# Patient Record
Sex: Male | Born: 1940 | Race: White | Marital: Single | Smoking: Never smoker
Health system: Southern US, Community
[De-identification: ages and names within clinical notes are randomized; demographics above are authoritative.]

## PROBLEM LIST (undated history)

## (undated) DIAGNOSIS — E785 Hyperlipidemia, unspecified: Secondary | ICD-10-CM

## (undated) DIAGNOSIS — N400 Enlarged prostate without lower urinary tract symptoms: Secondary | ICD-10-CM

## (undated) HISTORY — PX: TONSILLECTOMY: SUR1361

## (undated) HISTORY — PX: OTHER SURGICAL HISTORY: SHX169

## (undated) HISTORY — DX: Hyperlipidemia, unspecified: E78.5

---

## 2012-10-03 ENCOUNTER — Other Ambulatory Visit: Payer: Self-pay | Admitting: Family Medicine

## 2012-10-03 ENCOUNTER — Ambulatory Visit
Admission: RE | Admit: 2012-10-03 | Discharge: 2012-10-03 | Disposition: A | Discharge status: Home / Self Care | Payer: Federal, State, Local not specified - PPO | Source: Ambulatory Visit | Attending: Family Medicine | Admitting: Family Medicine

## 2012-10-03 DIAGNOSIS — M25572 Pain in left ankle and joints of left foot: Secondary | ICD-10-CM

## 2016-03-09 DIAGNOSIS — Z125 Encounter for screening for malignant neoplasm of prostate: Secondary | ICD-10-CM | POA: Diagnosis not present

## 2016-03-09 DIAGNOSIS — I499 Cardiac arrhythmia, unspecified: Secondary | ICD-10-CM | POA: Diagnosis not present

## 2016-03-09 DIAGNOSIS — E7800 Pure hypercholesterolemia, unspecified: Secondary | ICD-10-CM | POA: Diagnosis not present

## 2016-03-09 DIAGNOSIS — M542 Cervicalgia: Secondary | ICD-10-CM | POA: Diagnosis not present

## 2016-03-09 DIAGNOSIS — R300 Dysuria: Secondary | ICD-10-CM | POA: Diagnosis not present

## 2016-03-23 DIAGNOSIS — J209 Acute bronchitis, unspecified: Secondary | ICD-10-CM | POA: Diagnosis not present

## 2016-04-04 DIAGNOSIS — J209 Acute bronchitis, unspecified: Secondary | ICD-10-CM | POA: Diagnosis not present

## 2016-04-13 DIAGNOSIS — J209 Acute bronchitis, unspecified: Secondary | ICD-10-CM | POA: Diagnosis not present

## 2016-10-24 DIAGNOSIS — H353211 Exudative age-related macular degeneration, right eye, with active choroidal neovascularization: Secondary | ICD-10-CM | POA: Diagnosis not present

## 2016-11-29 DIAGNOSIS — H353211 Exudative age-related macular degeneration, right eye, with active choroidal neovascularization: Secondary | ICD-10-CM | POA: Diagnosis not present

## 2016-12-13 DIAGNOSIS — Z01818 Encounter for other preprocedural examination: Secondary | ICD-10-CM | POA: Diagnosis not present

## 2016-12-13 DIAGNOSIS — H2512 Age-related nuclear cataract, left eye: Secondary | ICD-10-CM | POA: Diagnosis not present

## 2017-01-03 DIAGNOSIS — H353211 Exudative age-related macular degeneration, right eye, with active choroidal neovascularization: Secondary | ICD-10-CM | POA: Diagnosis not present

## 2017-02-15 DIAGNOSIS — H353211 Exudative age-related macular degeneration, right eye, with active choroidal neovascularization: Secondary | ICD-10-CM | POA: Diagnosis not present

## 2017-03-11 DIAGNOSIS — I499 Cardiac arrhythmia, unspecified: Secondary | ICD-10-CM | POA: Diagnosis not present

## 2017-03-11 DIAGNOSIS — K219 Gastro-esophageal reflux disease without esophagitis: Secondary | ICD-10-CM | POA: Diagnosis not present

## 2017-03-11 DIAGNOSIS — Z23 Encounter for immunization: Secondary | ICD-10-CM | POA: Diagnosis not present

## 2017-03-11 DIAGNOSIS — E7800 Pure hypercholesterolemia, unspecified: Secondary | ICD-10-CM | POA: Diagnosis not present

## 2017-03-11 DIAGNOSIS — Z125 Encounter for screening for malignant neoplasm of prostate: Secondary | ICD-10-CM | POA: Diagnosis not present

## 2017-03-19 DIAGNOSIS — K080 Exfoliation of teeth due to systemic causes: Secondary | ICD-10-CM | POA: Diagnosis not present

## 2017-04-15 DIAGNOSIS — H353211 Exudative age-related macular degeneration, right eye, with active choroidal neovascularization: Secondary | ICD-10-CM | POA: Diagnosis not present

## 2017-06-17 DIAGNOSIS — H353211 Exudative age-related macular degeneration, right eye, with active choroidal neovascularization: Secondary | ICD-10-CM | POA: Diagnosis not present

## 2017-09-01 DIAGNOSIS — B353 Tinea pedis: Secondary | ICD-10-CM | POA: Diagnosis not present

## 2017-09-16 DIAGNOSIS — H353122 Nonexudative age-related macular degeneration, left eye, intermediate dry stage: Secondary | ICD-10-CM | POA: Diagnosis not present

## 2017-09-19 DIAGNOSIS — K080 Exfoliation of teeth due to systemic causes: Secondary | ICD-10-CM | POA: Diagnosis not present

## 2017-09-23 DIAGNOSIS — B353 Tinea pedis: Secondary | ICD-10-CM | POA: Diagnosis not present

## 2017-10-02 DIAGNOSIS — K080 Exfoliation of teeth due to systemic causes: Secondary | ICD-10-CM | POA: Diagnosis not present

## 2017-10-15 DIAGNOSIS — H353212 Exudative age-related macular degeneration, right eye, with inactive choroidal neovascularization: Secondary | ICD-10-CM | POA: Diagnosis not present

## 2018-02-12 DIAGNOSIS — H353212 Exudative age-related macular degeneration, right eye, with inactive choroidal neovascularization: Secondary | ICD-10-CM | POA: Diagnosis not present

## 2018-03-11 DIAGNOSIS — N401 Enlarged prostate with lower urinary tract symptoms: Secondary | ICD-10-CM | POA: Diagnosis not present

## 2018-03-11 DIAGNOSIS — Z23 Encounter for immunization: Secondary | ICD-10-CM | POA: Diagnosis not present

## 2018-03-11 DIAGNOSIS — E7800 Pure hypercholesterolemia, unspecified: Secondary | ICD-10-CM | POA: Diagnosis not present

## 2018-03-11 DIAGNOSIS — Z1211 Encounter for screening for malignant neoplasm of colon: Secondary | ICD-10-CM | POA: Diagnosis not present

## 2018-03-11 DIAGNOSIS — K219 Gastro-esophageal reflux disease without esophagitis: Secondary | ICD-10-CM | POA: Diagnosis not present

## 2018-03-26 DIAGNOSIS — K080 Exfoliation of teeth due to systemic causes: Secondary | ICD-10-CM | POA: Diagnosis not present

## 2018-04-28 DIAGNOSIS — K080 Exfoliation of teeth due to systemic causes: Secondary | ICD-10-CM | POA: Diagnosis not present

## 2018-09-04 DIAGNOSIS — H3321 Serous retinal detachment, right eye: Secondary | ICD-10-CM | POA: Diagnosis not present

## 2018-09-05 DIAGNOSIS — H34831 Tributary (branch) retinal vein occlusion, right eye, with macular edema: Secondary | ICD-10-CM | POA: Diagnosis not present

## 2018-09-05 DIAGNOSIS — H442E3 Degenerative myopia with other maculopathy, bilateral eye: Secondary | ICD-10-CM | POA: Diagnosis not present

## 2018-09-05 DIAGNOSIS — H353122 Nonexudative age-related macular degeneration, left eye, intermediate dry stage: Secondary | ICD-10-CM | POA: Diagnosis not present

## 2018-09-05 DIAGNOSIS — H35372 Puckering of macula, left eye: Secondary | ICD-10-CM | POA: Diagnosis not present

## 2018-09-08 DIAGNOSIS — H353122 Nonexudative age-related macular degeneration, left eye, intermediate dry stage: Secondary | ICD-10-CM | POA: Diagnosis not present

## 2018-09-08 DIAGNOSIS — H34831 Tributary (branch) retinal vein occlusion, right eye, with macular edema: Secondary | ICD-10-CM | POA: Diagnosis not present

## 2018-10-23 DIAGNOSIS — H34831 Tributary (branch) retinal vein occlusion, right eye, with macular edema: Secondary | ICD-10-CM | POA: Diagnosis not present

## 2019-02-02 DIAGNOSIS — H442E2 Degenerative myopia with other maculopathy, left eye: Secondary | ICD-10-CM | POA: Diagnosis not present

## 2019-02-02 DIAGNOSIS — H442A1 Degenerative myopia with choroidal neovascularization, right eye: Secondary | ICD-10-CM | POA: Diagnosis not present

## 2019-02-02 DIAGNOSIS — H35372 Puckering of macula, left eye: Secondary | ICD-10-CM | POA: Diagnosis not present

## 2019-02-02 DIAGNOSIS — H43813 Vitreous degeneration, bilateral: Secondary | ICD-10-CM | POA: Diagnosis not present

## 2019-03-12 ENCOUNTER — Ambulatory Visit
Admission: RE | Admit: 2019-03-12 | Discharge: 2019-03-12 | Disposition: A | Discharge status: Home / Self Care | Payer: Federal, State, Local not specified - PPO | Source: Ambulatory Visit | Attending: Family Medicine | Admitting: Family Medicine

## 2019-03-12 ENCOUNTER — Other Ambulatory Visit: Payer: Self-pay | Admitting: Family Medicine

## 2019-03-12 ENCOUNTER — Other Ambulatory Visit: Payer: Self-pay

## 2019-03-12 DIAGNOSIS — E7800 Pure hypercholesterolemia, unspecified: Secondary | ICD-10-CM | POA: Diagnosis not present

## 2019-03-12 DIAGNOSIS — R0602 Shortness of breath: Secondary | ICD-10-CM | POA: Diagnosis not present

## 2019-03-12 DIAGNOSIS — K219 Gastro-esophageal reflux disease without esophagitis: Secondary | ICD-10-CM | POA: Diagnosis not present

## 2019-03-12 DIAGNOSIS — N401 Enlarged prostate with lower urinary tract symptoms: Secondary | ICD-10-CM | POA: Diagnosis not present

## 2019-03-16 NOTE — Progress Notes (Signed)
Cardiology Office Note:    Date:  03/18/2019   ID:  Joshua Estrada, DOB 1940/07/08, MRN [id number]  PCP:  Shirline Frees, MD  Cardiologist:  No primary care provider on file.  Electrophysiologist:  None   Referring MD: Shirline Frees, MD   Chief Complaint  Patient presents with  . Chest Pain  . Shortness of Breath    History of Present Illness:    Joshua Estrada is a 79 y.o. male with a hx of HLD, BPH, GERD who is referred by Dr. [name] for evaluation of dyspnea on exertion.  He was seen by Dr. [name] and reported shortness of breath.  Chest x-ray showed cardiomegaly.  Was referred to cardiology for further evaluation.  He also reports that he has been having chest pain.  Describes as dull aching pain in his chest.  He previously noticed it recently, as his wife was admitted to the hospital.  Walking from the common parking garage to his hospital room could cause chest pain.  States that pain resolved with rest.  Also reported significant severe shortness of breath with exertion  Never smoked.  Father had CHF.     Past Medical History:  Diagnosis Date  . Hyperlipidemia     History reviewed. No pertinent surgical history.  Current Medications: Current Meds  Medication Sig  . aspirin EC 81 MG tablet Take 81 mg by mouth daily.  . famotidine (PEPCID) 40 MG tablet Take 1 tablet by mouth daily.  . Multiple Vitamins-Minerals (PRESERVISION AREDS 2 PO) Take 1 tablet by mouth daily.  . simvastatin (ZOCOR) 20 MG tablet Take 20 mg by mouth at bedtime.  . tamsulosin (FLOMAX) 0.4 MG CAPS capsule Take 0.4 mg by mouth daily.     Allergies:   Patient has no known allergies.   Social History   Socioeconomic History  . Marital status: Single    Spouse name: Not on file  . Number of children: Not on file  . Years of education: Not on file  . Highest education level: Not on file  Occupational History  . Not on file  Tobacco Use  . Smoking status: Never Smoker  . Smokeless tobacco: Never  Used  Substance and Sexual Activity  . Alcohol use: Never  . Drug use: Never  . Sexual activity: Not on file  Other Topics Concern  . Not on file  Social History Narrative  . Not on file   Social Determinants of Health   Financial Resource Strain:   . Difficulty of Paying Living Expenses: Not on file  Food Insecurity:   . Worried About Charity fundraiser in the Last Year: Not on file  . Ran Out of Food in the Last Year: Not on file  Transportation Needs:   . Lack of Transportation (Medical): Not on file  . Lack of Transportation (Non-Medical): Not on file  Physical Activity:   . Days of Exercise per Week: Not on file  . Minutes of Exercise per Session: Not on file  Stress:   . Feeling of Stress : Not on file  Social Connections:   . Frequency of Communication with Friends and Family: Not on file  . Frequency of Social Gatherings with Friends and Family: Not on file  . Attends Religious Services: Not on file  . Active Member of Clubs or Organizations: Not on file  . Attends Archivist Meetings: Not on file  . Marital Status: Not on file     Family History: Father  had CHF  ROS:   Please see the history of present illness.    All other systems reviewed and are negative.  EKGs/Labs/Other Studies Reviewed:    The following studies were reviewed today:   EKG:  EKG is ordered today.  The ekg ordered today demonstrates sinus rhythm with significant sinus arrhythmia, rate 80, no ST/T abnormalities  Recent Labs: 03/17/2019: ALT 10; BUN 14; Creatinine, Ser 1.13; Hemoglobin 4.2; NT-Pro BNP 614; Platelets 397; Potassium 4.8; Sodium 140  Recent Lipid Panel No results found for: CHOL, TRIG, HDL, CHOLHDL, VLDL, LDLCALC, LDLDIRECT  Physical Exam:    VS:  BP (!) 154/74   Pulse 80   Ht 6' 2.5" (1.892 m)   Wt 206 lb (93.4 kg)   BMI 26.10 kg/m     Wt Readings from Last 3 Encounters:  03/17/19 206 lb (93.4 kg)     GEN:   in no acute distress HEENT: Normal NECK:  No JVD LYMPHATICS: No lymphadenopathy CARDIAC: Irregular, 2/6 systolic murmur RESPIRATORY:  Clear to auscultation without rales, wheezing or rhonchi  ABDOMEN: Soft, non-tender, non-distended MUSCULOSKELETAL:  2+ BLE edema SKIN: Warm and dry NEUROLOGIC:  Alert and oriented x 3 PSYCHIATRIC:  Normal affect   ASSESSMENT:    1. Precordial pain   2. Bilateral lower extremity edema   3. Medication management    PLAN:    In order of problems listed above:  Chest pain: Description suggest angina, as describes dull aching pain in chest with exertion that resolves with rest.  Not a good candidate for coronary CTA given irregular heart rhythm.  Will evaluate with Lexiscan Myoview.  Will check CMET, CBC.  ADDENDUM: CBC shows hemoglobin 4.2.  Critical result was called to covering physician overnight, Dr. Liane Comber, who instructed patient to go to the ED immediately for evaluation.  Suspect his anginal symptoms are due to severe anemia.  Bilateral lower extremity edema: will check CMET, BNP.  Will order TTE.  Will order LE Duplex to rule out DVT  Hyperlipidemia: On simvastatin 20 mg daily.  LDL 71 on 03/11/18.   RTC in 1 month  Medication Adjustments/Labs and Tests Ordered: Current medicines are reviewed at length with the patient today.  Concerns regarding medicines are outlined above.  Orders Placed This Encounter  Procedures  . CBC  . Pro b natriuretic peptide (BNP)[facility])  . Comprehensive Metabolic Panel (CMET)  . Myocardial Perfusion Imaging  . EKG 12-Lead  . ECHOCARDIOGRAM COMPLETE  . LE VENOUS   No orders of the defined types were placed in this encounter.   Patient Instructions  Medication Instructions:  Continue current medications  *If you need a refill on your cardiac medications before your next appointment, please call your pharmacy*  Lab Work: CBC, CMP and BNP Today  If you have labs (blood work) drawn today and your tests are completely  normal, you will receive your results only by: Marland Kitchen MyChart Message (if you have MyChart) OR . A paper copy in the mail If you have any lab test that is abnormal or we need to change your treatment, we will call you to review the results.  Testing/Procedures: Your physician has requested that you have an echocardiogram. Echocardiography is a painless test that uses sound waves to create images of your heart. It provides your doctor with information about the size and shape of your heart and how well your heart's chambers and valves are working. This procedure takes approximately one hour. There are no restrictions  for this procedure.  Your physician has requested that you have a lexiscan myoview. For further information please visit [email or web address] Please follow instruction sheet, as given.  Your physician has requested that you have a lower extremity venous duplex. This test is an ultrasound of the veins in the legs. It looks at venous blood flow that carries blood from the heart to the legs. Allow one hour for a Lower Venous exam. Allow thirty minutes for an Upper Venous exam. There are no restrictions or special instructions.  Follow-Up: At [facility], you and your health needs are our priority.  As part of our continuing mission to provide you with exceptional heart care, we have created designated Provider Care Teams.  These Care Teams include your primary Cardiologist (physician) and Advanced Practice Providers (APPs -  Physician Assistants and Nurse Practitioners) who all work together to provide you with the care you need, when you need it.  Your next appointment:   2 month(s)  The format for your next appointment:   In Person  Provider:   Epifanio Lesches, MD        Signed, Little Ishikawa, MD  03/18/2019 7:08 AM    [facility]

## 2019-03-17 ENCOUNTER — Other Ambulatory Visit: Payer: Self-pay

## 2019-03-17 ENCOUNTER — Ambulatory Visit: Payer: Federal, State, Local not specified - PPO | Admitting: Cardiology

## 2019-03-17 ENCOUNTER — Encounter: Payer: Self-pay | Admitting: Cardiology

## 2019-03-17 VITALS — BP 154/74 | HR 80 | Ht 74.5 in | Wt 206.0 lb

## 2019-03-17 DIAGNOSIS — R072 Precordial pain: Secondary | ICD-10-CM | POA: Diagnosis not present

## 2019-03-17 DIAGNOSIS — R600 Localized edema: Secondary | ICD-10-CM | POA: Diagnosis not present

## 2019-03-17 DIAGNOSIS — Z79899 Other long term (current) drug therapy: Secondary | ICD-10-CM

## 2019-03-17 DIAGNOSIS — E785 Hyperlipidemia, unspecified: Secondary | ICD-10-CM

## 2019-03-17 NOTE — Patient Instructions (Signed)
Medication Instructions:  Continue current medications  *If you need a refill on your cardiac medications before your next appointment, please call your pharmacy*  Lab Work: CBC, CMP and BNP Today  If you have labs (blood work) drawn today and your tests are completely normal, you will receive your results only by: Marland Kitchen MyChart Message (if you have MyChart) OR . A paper copy in the mail If you have any lab test that is abnormal or we need to change your treatment, we will call you to review the results.  Testing/Procedures: Your physician has requested that you have an echocardiogram. Echocardiography is a painless test that uses sound waves to create images of your heart. It provides your doctor with information about the size and shape of your heart and how well your heart's chambers and valves are working. This procedure takes approximately one hour. There are no restrictions for this procedure.  Your physician has requested that you have a lexiscan myoview. For further information please visit [email or web address] Please follow instruction sheet, as given.  Your physician has requested that you have a lower extremity venous duplex. This test is an ultrasound of the veins in the legs. It looks at venous blood flow that carries blood from the heart to the legs. Allow one hour for a Lower Venous exam. Allow thirty minutes for an Upper Venous exam. There are no restrictions or special instructions.  Follow-Up: At [facility], you and your health needs are our priority.  As part of our continuing mission to provide you with exceptional heart care, we have created designated Provider Care Teams.  These Care Teams include your primary Cardiologist (physician) and Advanced Practice Providers (APPs -  Physician Assistants and Nurse Practitioners) who all work together to provide you with the care you need, when you need it.  Your next appointment:   2 month(s)  The format for your next  appointment:   In Person  Provider:   Epifanio Lesches, MD

## 2019-03-18 ENCOUNTER — Telehealth (HOSPITAL_COMMUNITY): Payer: Self-pay

## 2019-03-18 ENCOUNTER — Other Ambulatory Visit: Payer: Self-pay

## 2019-03-18 ENCOUNTER — Telehealth: Payer: Self-pay | Admitting: Internal Medicine

## 2019-03-18 ENCOUNTER — Inpatient Hospital Stay (HOSPITAL_COMMUNITY)
Admission: EM | Admit: 2019-03-18 | Discharge: 2019-03-24 | DRG: 812 | Disposition: A | Discharge status: Home / Self Care | Payer: Federal, State, Local not specified - PPO | Attending: Internal Medicine | Admitting: Internal Medicine

## 2019-03-18 ENCOUNTER — Observation Stay (HOSPITAL_COMMUNITY): Payer: Federal, State, Local not specified - PPO

## 2019-03-18 ENCOUNTER — Encounter (HOSPITAL_COMMUNITY): Payer: Self-pay | Admitting: Emergency Medicine

## 2019-03-18 DIAGNOSIS — K5730 Diverticulosis of large intestine without perforation or abscess without bleeding: Secondary | ICD-10-CM | POA: Diagnosis present

## 2019-03-18 DIAGNOSIS — K449 Diaphragmatic hernia without obstruction or gangrene: Secondary | ICD-10-CM | POA: Diagnosis not present

## 2019-03-18 DIAGNOSIS — E785 Hyperlipidemia, unspecified: Secondary | ICD-10-CM | POA: Diagnosis present

## 2019-03-18 DIAGNOSIS — Z7982 Long term (current) use of aspirin: Secondary | ICD-10-CM | POA: Diagnosis not present

## 2019-03-18 DIAGNOSIS — Z803 Family history of malignant neoplasm of breast: Secondary | ICD-10-CM

## 2019-03-18 DIAGNOSIS — D649 Anemia, unspecified: Secondary | ICD-10-CM

## 2019-03-18 DIAGNOSIS — D72819 Decreased white blood cell count, unspecified: Secondary | ICD-10-CM | POA: Diagnosis not present

## 2019-03-18 DIAGNOSIS — I82452 Acute embolism and thrombosis of left peroneal vein: Secondary | ICD-10-CM | POA: Diagnosis present

## 2019-03-18 DIAGNOSIS — D500 Iron deficiency anemia secondary to blood loss (chronic): Secondary | ICD-10-CM | POA: Diagnosis present

## 2019-03-18 DIAGNOSIS — K219 Gastro-esophageal reflux disease without esophagitis: Secondary | ICD-10-CM | POA: Diagnosis present

## 2019-03-18 DIAGNOSIS — N400 Enlarged prostate without lower urinary tract symptoms: Secondary | ICD-10-CM | POA: Diagnosis not present

## 2019-03-18 DIAGNOSIS — Z8249 Family history of ischemic heart disease and other diseases of the circulatory system: Secondary | ICD-10-CM | POA: Diagnosis not present

## 2019-03-18 DIAGNOSIS — R0600 Dyspnea, unspecified: Secondary | ICD-10-CM | POA: Diagnosis not present

## 2019-03-18 DIAGNOSIS — Z20822 Contact with and (suspected) exposure to covid-19: Secondary | ICD-10-CM | POA: Diagnosis present

## 2019-03-18 DIAGNOSIS — R5383 Other fatigue: Secondary | ICD-10-CM | POA: Diagnosis not present

## 2019-03-18 DIAGNOSIS — K2100 Gastro-esophageal reflux disease with esophagitis, without bleeding: Secondary | ICD-10-CM | POA: Diagnosis not present

## 2019-03-18 DIAGNOSIS — I4891 Unspecified atrial fibrillation: Secondary | ICD-10-CM | POA: Diagnosis not present

## 2019-03-18 DIAGNOSIS — E538 Deficiency of other specified B group vitamins: Secondary | ICD-10-CM | POA: Diagnosis present

## 2019-03-18 DIAGNOSIS — K635 Polyp of colon: Secondary | ICD-10-CM | POA: Diagnosis present

## 2019-03-18 DIAGNOSIS — D62 Acute posthemorrhagic anemia: Secondary | ICD-10-CM | POA: Insufficient documentation

## 2019-03-18 DIAGNOSIS — R001 Bradycardia, unspecified: Secondary | ICD-10-CM | POA: Diagnosis not present

## 2019-03-18 DIAGNOSIS — I480 Paroxysmal atrial fibrillation: Secondary | ICD-10-CM | POA: Diagnosis not present

## 2019-03-18 DIAGNOSIS — I517 Cardiomegaly: Secondary | ICD-10-CM | POA: Diagnosis present

## 2019-03-18 DIAGNOSIS — R609 Edema, unspecified: Secondary | ICD-10-CM | POA: Diagnosis not present

## 2019-03-18 HISTORY — DX: Benign prostatic hyperplasia without lower urinary tract symptoms: N40.0

## 2019-03-18 LAB — IRON AND TIBC
Iron: 6 ug/dL — ABNORMAL LOW (ref 45–182)
Saturation Ratios: 1 % — ABNORMAL LOW (ref 17.9–39.5)
TIBC: 543 ug/dL — ABNORMAL HIGH (ref 250–450)
UIBC: 537 ug/dL

## 2019-03-18 LAB — COMPREHENSIVE METABOLIC PANEL
ALT: 10 IU/L (ref 0–44)
ALT: 15 U/L (ref 0–44)
AST: 11 IU/L (ref 0–40)
AST: 16 U/L (ref 15–41)
Albumin/Globulin Ratio: 2.4 — ABNORMAL HIGH (ref 1.2–2.2)
Albumin: 4.5 g/dL (ref 3.7–4.7)
Albumin: 4.4 g/dL (ref 3.5–5.0)
Alkaline Phosphatase: 78 IU/L (ref 39–117)
Alkaline Phosphatase: 66 U/L (ref 38–126)
Anion gap: 10 (ref 5–15)
BUN/Creatinine Ratio: 12 (ref 10–24)
BUN: 14 mg/dL (ref 8–27)
BUN: 21 mg/dL (ref 8–23)
Bilirubin Total: 0.4 mg/dL (ref 0.0–1.2)
CO2: 20 mmol/L (ref 20–29)
CO2: 21 mmol/L — ABNORMAL LOW (ref 22–32)
Calcium: 8.9 mg/dL (ref 8.6–10.2)
Calcium: 9.1 mg/dL (ref 8.9–10.3)
Chloride: 107 mmol/L — ABNORMAL HIGH (ref 96–106)
Chloride: 108 mmol/L (ref 98–111)
Creatinine, Ser: 1.13 mg/dL (ref 0.76–1.27)
Creatinine, Ser: 1.18 mg/dL (ref 0.61–1.24)
GFR calc Af Amer: 73 mL/min/{1.73_m2} (ref >59)
GFR calc Af Amer: >60 mL/min (ref >60)
GFR calc non Af Amer: 63 mL/min/{1.73_m2} (ref >59)
GFR calc non Af Amer: 60 mL/min — ABNORMAL LOW (ref >60)
Globulin, Total: 1.9 g/dL (ref 1.5–4.5)
Glucose, Bld: 111 mg/dL — ABNORMAL HIGH (ref 70–99)
Glucose: 106 mg/dL — ABNORMAL HIGH (ref 65–99)
Potassium: 4.8 mmol/L (ref 3.5–5.2)
Potassium: 4.2 mmol/L (ref 3.5–5.1)
Sodium: 140 mmol/L (ref 134–144)
Sodium: 139 mmol/L (ref 135–145)
Total Bilirubin: 0.8 mg/dL (ref 0.3–1.2)
Total Protein: 6.4 g/dL (ref 6.0–8.5)
Total Protein: 7.1 g/dL (ref 6.5–8.1)

## 2019-03-18 LAB — TSH: TSH: 1.383 u[IU]/mL (ref 0.350–4.500)

## 2019-03-18 LAB — CBC WITH DIFFERENTIAL/PLATELET
Abs Immature Granulocytes: 0.01 10*3/uL (ref 0.00–0.07)
Basophils Absolute: 0 10*3/uL (ref 0.0–0.1)
Basophils Relative: 0 %
Eosinophils Absolute: 0.1 10*3/uL (ref 0.0–0.5)
Eosinophils Relative: 2 %
HCT: 17.4 % — ABNORMAL LOW (ref 39.0–52.0)
Hemoglobin: 4.3 g/dL — CL (ref 13.0–17.0)
Immature Granulocytes: 0 %
Lymphocytes Relative: 29 %
Lymphs Abs: 1 10*3/uL (ref 0.7–4.0)
MCH: 17.3 pg — ABNORMAL LOW (ref 26.0–34.0)
MCHC: 24.7 g/dL — ABNORMAL LOW (ref 30.0–36.0)
MCV: 69.9 fL — ABNORMAL LOW (ref 80.0–100.0)
Monocytes Absolute: 0.4 10*3/uL (ref 0.1–1.0)
Monocytes Relative: 10 %
Neutro Abs: 2 10*3/uL (ref 1.7–7.7)
Neutrophils Relative %: 59 %
Platelets: 331 10*3/uL (ref 150–400)
RBC: 2.49 MIL/uL — ABNORMAL LOW (ref 4.22–5.81)
RDW: 19.2 % — ABNORMAL HIGH (ref 11.5–15.5)
WBC: 3.5 10*3/uL — ABNORMAL LOW (ref 4.0–10.5)
nRBC: 0 % (ref 0.0–0.2)

## 2019-03-18 LAB — VITAMIN B12: Vitamin B-12: 128 pg/mL — ABNORMAL LOW (ref 180–914)

## 2019-03-18 LAB — FOLATE: Folate: 7.2 ng/mL (ref >5.9)

## 2019-03-18 LAB — PREPARE RBC (CROSSMATCH)

## 2019-03-18 LAB — RETICULOCYTES
Immature Retic Fract: 17.3 % — ABNORMAL HIGH (ref 2.3–15.9)
RBC.: 2.46 MIL/uL — ABNORMAL LOW (ref 4.22–5.81)
Retic Count, Absolute: 45.8 10*3/uL (ref 19.0–186.0)
Retic Ct Pct: 1.9 % (ref 0.4–3.1)

## 2019-03-18 LAB — ABO/RH: ABO/RH(D): AB POS

## 2019-03-18 LAB — CBC
Hematocrit: 16.6 % — CL (ref 37.5–51.0)
Hemoglobin: 4.2 g/dL — CL (ref 13.0–17.7)
MCH: 16.9 pg — ABNORMAL LOW (ref 26.6–33.0)
MCHC: 25.3 g/dL — ABNORMAL LOW (ref 31.5–35.7)
MCV: 67 fL — ABNORMAL LOW (ref 79–97)
Platelets: 397 10*3/uL (ref 150–450)
RBC: 2.48 x10E6/uL — CL (ref 4.14–5.80)
RDW: 17.6 % — ABNORMAL HIGH (ref 11.6–15.4)
WBC: 4 10*3/uL (ref 3.4–10.8)

## 2019-03-18 LAB — HEMOGLOBIN AND HEMATOCRIT, BLOOD
HCT: 23.8 % — ABNORMAL LOW (ref 39.0–52.0)
HCT: 23.1 % — ABNORMAL LOW (ref 39.0–52.0)
Hemoglobin: 7 g/dL — ABNORMAL LOW (ref 13.0–17.0)
Hemoglobin: 6.7 g/dL — CL (ref 13.0–17.0)

## 2019-03-18 LAB — SARS CORONAVIRUS 2 (TAT 6-24 HRS): SARS Coronavirus 2: NEGATIVE

## 2019-03-18 LAB — FERRITIN: Ferritin: 3 ng/mL — ABNORMAL LOW (ref 24–336)

## 2019-03-18 LAB — POC OCCULT BLOOD, ED: Fecal Occult Bld: NEGATIVE

## 2019-03-18 LAB — PROTIME-INR
INR: 1.1 (ref 0.8–1.2)
Prothrombin Time: 14.4 seconds (ref 11.4–15.2)

## 2019-03-18 LAB — PRO B NATRIURETIC PEPTIDE: NT-Pro BNP: 614 pg/mL — ABNORMAL HIGH (ref 0–486)

## 2019-03-18 MED ORDER — SODIUM CHLORIDE 0.9% IV SOLUTION: Freq: Once | INTRAVENOUS | Status: AC

## 2019-03-18 MED ORDER — SODIUM CHLORIDE 0.9% IV SOLUTION: Freq: Once | INTRAVENOUS | Status: DC

## 2019-03-18 MED ORDER — SODIUM CHLORIDE 0.9 % IV SOLN: Freq: Once | INTRAVENOUS | Status: AC

## 2019-03-18 MED ORDER — ACETAMINOPHEN 650 MG RE SUPP: 650.0000 mg | Freq: Four times a day (QID) | RECTAL | Status: DC | PRN

## 2019-03-18 MED ORDER — FAMOTIDINE 20 MG PO TABS
20.0000 mg | ORAL_TABLET | Freq: Two times a day (BID) | ORAL | Status: DC
  Administered 2019-03-18 – 2019-03-24 (×11): 20 mg via ORAL
  Filled 2019-03-18 (×11): qty 1

## 2019-03-18 MED ORDER — SODIUM CHLORIDE 0.9 % IV SOLN
510.0000 mg | Freq: Once | INTRAVENOUS | Status: AC
  Administered 2019-03-19: 510 mg via INTRAVENOUS
  Filled 2019-03-18: qty 17

## 2019-03-18 MED ORDER — ALBUTEROL SULFATE (2.5 MG/3ML) 0.083% IN NEBU: 2.5000 mg | INHALATION_SOLUTION | Freq: Four times a day (QID) | RESPIRATORY_TRACT | Status: DC | PRN

## 2019-03-18 MED ORDER — VITAMIN B-12 1000 MCG PO TABS
1000.0000 ug | ORAL_TABLET | Freq: Every day | ORAL | Status: DC
  Administered 2019-03-18 – 2019-03-24 (×6): 1000 ug via ORAL
  Filled 2019-03-18 (×6): qty 1

## 2019-03-18 MED ORDER — TAMSULOSIN HCL 0.4 MG PO CAPS
0.4000 mg | ORAL_CAPSULE | Freq: Every day | ORAL | Status: DC
  Administered 2019-03-19 – 2019-03-23 (×6): 0.4 mg via ORAL
  Filled 2019-03-18 (×6): qty 1

## 2019-03-18 MED ORDER — ACETAMINOPHEN 325 MG PO TABS: 650.0000 mg | ORAL_TABLET | Freq: Four times a day (QID) | ORAL | Status: DC | PRN

## 2019-03-18 MED ORDER — ONDANSETRON HCL 4 MG/2ML IJ SOLN: 4.0000 mg | Freq: Four times a day (QID) | INTRAMUSCULAR | Status: DC | PRN

## 2019-03-18 MED ORDER — ONDANSETRON HCL 4 MG PO TABS: 4.0000 mg | ORAL_TABLET | Freq: Four times a day (QID) | ORAL | Status: DC | PRN

## 2019-03-18 MED ORDER — SODIUM CHLORIDE 0.9% FLUSH
3.0000 mL | Freq: Two times a day (BID) | INTRAVENOUS | Status: DC
  Administered 2019-03-19 – 2019-03-24 (×7): 3 mL via INTRAVENOUS

## 2019-03-18 MED ORDER — SODIUM CHLORIDE 0.9 % IV SOLN
10.0000 mL/h | Freq: Once | INTRAVENOUS | Status: AC
  Administered 2019-03-18: 10 mL/h via INTRAVENOUS

## 2019-03-18 MED ORDER — SIMVASTATIN 20 MG PO TABS
20.0000 mg | ORAL_TABLET | Freq: Every day | ORAL | Status: DC
  Administered 2019-03-19 – 2019-03-23 (×6): 20 mg via ORAL
  Filled 2019-03-18 (×6): qty 1

## 2019-03-18 MED ORDER — FUROSEMIDE 10 MG/ML IJ SOLN
20.0000 mg | Freq: Once | INTRAMUSCULAR | Status: AC
  Administered 2019-03-18: 20 mg via INTRAVENOUS
  Filled 2019-03-18: qty 2

## 2019-03-18 NOTE — Telephone Encounter (Signed)
Encounter complete. 

## 2019-03-18 NOTE — Progress Notes (Signed)
1700 Received pt from ED via stretcher. Assisted to bed. Third unit of blood transfusion was completed in ED.

## 2019-03-18 NOTE — Progress Notes (Addendum)
CRITICAL VALUE STICKER  CRITICAL VALUE: Hemoglobin 6.7  RECEIVER: Beverely Low  DATE & TIME NOTIFIED: 03/18/19 2004  MESSENGER (representative from lab): Santina Evans   MD NOTIFIED: On call hosp. MD M. Denny  TIME OF NOTIFICATION: 2024  RESPONSE: Waiting new orders.  (2100- New orders for 2units of RBC)    0733: patient tolerated transfusions well and has completed both units. Post H&H placed to be drawn.

## 2019-03-18 NOTE — ED Notes (Signed)
Left message for daughter to update on pt

## 2019-03-18 NOTE — ED Provider Notes (Signed)
[facility] EMERGENCY DEPARTMENT Provider Note   CSN: [id number] Arrival date & time: 03/18/19  8299     History Chief Complaint  Patient presents with  . Abnormal Labs    Hgb=4.2    Joshua Estrada is a 79 y.o. male.  Patient is a 79 year old male with a history of hyperlipidemia who is presenting today because he was called by his doctor with an abnormal hemoglobin of 4.2.  Patient states for the last 2 to 3 months he has had exertional dyspnea that improves with sitting down but over the last month he has become much more fatigued and even with minimal exertion he has to sit down and rest because he just becomes so tired.  He has not had any chest pain and he does not have any resting shortness of breath.  He has not noticed any black or bloody stools.  He had a heme occult test done a few years ago which was negative and his not had abnormal colonoscopies in the past.  He takes an 81 mg aspirin daily but no other anticoagulation.  No history of anemia.  He was seeing the cardiologist yesterday because on chest x-ray he had some cardiomegaly and was following up for that.  He does not have a history of hypertension and denies any recent Covid-like symptoms such as fever, cough, congestion.  The history is provided by the patient.       Past Medical History:  Diagnosis Date  . Hyperlipidemia     There are no problems to display for this patient.   History reviewed. No pertinent surgical history.     No family history on file.  Social History   Tobacco Use  . Smoking status: Never Smoker  . Smokeless tobacco: Never Used  Substance Use Topics  . Alcohol use: Never  . Drug use: Never    Home Medications Prior to Admission medications   Medication Sig Start Date End Date Taking? Authorizing Provider  aspirin EC 81 MG tablet Take 81 mg by mouth daily.    [provider]  famotidine (PEPCID) 40 MG tablet Take 1 tablet by mouth daily. 02/26/19    [provider]  Multiple Vitamins-Minerals (PRESERVISION AREDS 2 PO) Take 1 tablet by mouth daily.    [provider]  simvastatin (ZOCOR) 20 MG tablet Take 20 mg by mouth at bedtime. 02/26/19   [provider]  tamsulosin (FLOMAX) 0.4 MG CAPS capsule Take 0.4 mg by mouth daily. 02/01/19   [provider]    Allergies    Patient has no known allergies.  Review of Systems   Review of Systems  All other systems reviewed and are negative.   Physical Exam Updated Vital Signs BP 134/67   Pulse (!) 105   Temp 98.8 F (37.1 C) (Oral)   SpO2 92%   Physical Exam Vitals and nursing note reviewed.  Constitutional:      General: He is not in acute distress.    Appearance: He is well-developed and normal weight.  HENT:     Head: Normocephalic and atraumatic.  Eyes:     Pupils: Pupils are equal, round, and reactive to light.     Comments: Pale conjunctive a  Cardiovascular:     Rate and Rhythm: Normal rate and regular rhythm.     Heart sounds: No murmur.  Pulmonary:     Effort: Pulmonary effort is normal. No respiratory distress.     Breath sounds: Normal breath  sounds. No wheezing or rales.  Abdominal:     General: There is no distension.     Palpations: Abdomen is soft.     Tenderness: There is no abdominal tenderness. There is no guarding or rebound.  Genitourinary:    Rectum: Normal. Guaiac result negative.  Musculoskeletal:        General: No tenderness. Normal range of motion.     Cervical back: Normal range of motion and neck supple.     Right lower leg: Edema present.     Left lower leg: Edema present.  Skin:    General: Skin is warm and dry.     Coloration: Skin is pale.     Findings: No erythema or rash.  Neurological:     General: No focal deficit present.     Mental Status: He is alert and oriented to person, place, and time. Mental status is at baseline.  Psychiatric:        Behavior: Behavior normal.     ED Results /  Procedures / Treatments   Labs (all labs ordered are listed, but only abnormal results are displayed) Labs Reviewed  CBC WITH DIFFERENTIAL/PLATELET - Abnormal; Notable for the following components:      Result Value   WBC 3.5 (*)    RBC 2.49 (*)    Hemoglobin 4.3 (*)    HCT 17.4 (*)    MCV 69.9 (*)    MCH 17.3 (*)    MCHC 24.7 (*)    RDW 19.2 (*)    All other components within normal limits  COMPREHENSIVE METABOLIC PANEL - Abnormal; Notable for the following components:   CO2 21 (*)    Glucose, Bld 111 (*)    GFR calc non Af Amer 60 (*)    All other components within normal limits  RETICULOCYTES - Abnormal; Notable for the following components:   RBC. 2.46 (*)    Immature Retic Fract 17.3 (*)    All other components within normal limits  SARS CORONAVIRUS 2 (TAT 6-24 HRS)  VITAMIN B12  FOLATE  IRON AND TIBC  FERRITIN  PROTIME-INR  POC OCCULT BLOOD, ED  TYPE AND SCREEN  PREPARE RBC (CROSSMATCH)  ABO/RH  PREPARE RBC (CROSSMATCH)    EKG EKG Interpretation  Date/Time:  Wednesday March 18 2019 06:55:58 EST Ventricular Rate:  81 PR Interval:  184 QRS Duration: 80 QT Interval:  388 QTC Calculation: 450 R Axis:   45 Text Interpretation: Sinus rhythm with marked sinus arrhythmia Otherwise normal ECG No significant change since last tracing Confirmed by Blanchie Dessert [phone]) on 03/18/2019 7:35:35 AM   Radiology No results found.  Procedures Procedures (including critical care time)  Medications Ordered in ED Medications  0.9 %  sodium chloride infusion (has no administration in time range)    ED Course  I have reviewed the triage vital signs and the nursing notes.  Pertinent labs & imaging results that were available during my care of the patient were reviewed by me and considered in my medical decision making (see chart for details).    MDM Rules/Calculators/A&P                      79 year old male presenting today with severe anemia.  Months of  worsening symptoms.  Stable on exam with mild tachycardia and oxygen saturation of 92%.  Patient has no abdominal pain and is denying any chest pain.  Labs are pending and Hemoccult is negative.  BMP from yesterday  was within normal limits, BNP was elevated at 600 and chest x-ray from last week showed no acute lung issues with cardiomegaly.  Given patient is not having any specific new symptoms related to heart or lungs do not feel a repeat x-ray is necessary at this time.  10:51 AM Patient CBC is consistent with a hemoglobin of 4.3 but also a mild leukopenia with a white count of 3.5, decreased MCV but normal platelet count.  Reticulocyte site count is normal but immature reticulocytes are elevated.  Rest of anemia panel is still pending.  Patient will be transfused 2 units.  Will admit for further care.  Concern for bone marrow issue is the cause of his symptoms.  Hemoccult was negative.  CRITICAL CARE Performed by: Jwan Hornbaker Total critical care time: 30 minutes Critical care time was exclusive of separately billable procedures and treating other patients. Critical care was necessary to treat or prevent imminent or life-threatening deterioration. Critical care was time spent personally by me on the following activities: development of treatment plan with patient and/or surrogate as well as nursing, discussions with consultants, evaluation of patient's response to treatment, examination of patient, obtaining history from patient or surrogate, ordering and performing treatments and interventions, ordering and review of laboratory studies, ordering and review of radiographic studies, pulse oximetry and re-evaluation of patient's condition.   Final Clinical Impression(s) / ED Diagnoses Final diagnoses:  Symptomatic anemia    Rx / DC Orders ED Discharge Orders    None       Gwyneth Sprout, MD 03/18/19 1052

## 2019-03-18 NOTE — ED Triage Notes (Signed)
Patient received a call from his MD this morning advised him to go ER for blood transfusion , Hgb=4.9 , pale , denies rectal bleeding , respirations unlabored / denies pain .

## 2019-03-18 NOTE — Telephone Encounter (Signed)
Cardiology Moonlighter Note  Returned page from [place]. Patient had bloodwork drawn yesterday to work up shortness of breath and chest pain. Was found to have hemoglobin of 4.2 (hematocrit 16.6).   I contacted the patient by phone and made him aware of these results. I instructed him to come to the ED immediately for evaluation. He should either have someone bring him to the ED immediately or call 911 for transport. He agrees and will do so.  Rosario Jacks, MD Cardiology Fellow, PGY-7

## 2019-03-18 NOTE — ED Notes (Signed)
Pt given urinal.

## 2019-03-18 NOTE — H&P (Addendum)
History and Physical    Joshua Estrada IHK:[id number] DOB: 12-28-1940 DOA: 03/18/2019  Referring MD/NP/PA: Blanchie Dessert PCP: Shirline Frees, MD  Patient coming from: Home  Chief Complaint: Low blood count  I have personally briefly reviewed patient's old medical records in [place]   HPI: Joshua Estrada is a 79 y.o. male with medical history significant of hyperlipidemia; who presents after being told that he had an low blood counts.  Patient had followed up with cardiology after his primary care provider had obtained a chest x-ray which noted cardiomegaly.  Over the last 2 months he had been experiencing progressive exertional dyspnea.  Complains of being unable to walk or do anything without needing to rest.  Associated symptoms include lower extremity swelling.  He has not had any dark or bloody stools to his knowledge.  Patient takes a daily aspirin when he remember, but is not on any other anticoagulation.  Denies use of any NSAIDs, abdominal pain, weight loss to his knowledge, nausea, vomiting, or diarrhea.  Patient notes that he has had a stool test done several years ago, but has never had a colostomy.   ED Course: Upon admission to the emergency department patient was noted to be slightly tachypneic and tachycardic, but all other vital signs within normal limits.  Labs significant for WBC 3.5 and hemoglobin 4.3 with low MCV and MCH.  An anemia panel was pending.  Stool guaiacs were noted to be negative.  Patient was ordered to be transfused 3 units of packed red blood cells.  TRH called to admit.  Review of Systems  Constitutional: Positive for malaise/fatigue. Negative for fever and weight loss.  HENT: Negative for congestion and sinus pain.   Eyes: Negative for photophobia and pain.  Respiratory: Positive for shortness of breath. Negative for cough.   Cardiovascular: Positive for leg swelling. Negative for chest pain.  Gastrointestinal: Negative for abdominal pain, blood in  stool, nausea and vomiting.  Genitourinary: Negative for dysuria and hematuria.  Musculoskeletal: Negative for falls and myalgias.  Skin: Negative for rash.  Neurological: Negative for focal weakness and loss of consciousness.  Psychiatric/Behavioral: Negative for substance abuse.    Past Medical History:  Diagnosis Date  . BPH (benign prostatic hyperplasia)   . Hyperlipidemia     Past Surgical History:  Procedure Laterality Date  . Cataract surgery    . TONSILLECTOMY     As a child     reports that he has never smoked. He has never used smokeless tobacco. He reports that he does not drink alcohol or use drugs.  No Known Allergies  Family History  Problem Relation Age of Onset  . Breast cancer Mother   . Congestive Heart Failure Father     Prior to Admission medications   Medication Sig Start Date End Date Taking? Authorizing Provider  aspirin EC 81 MG tablet Take 81 mg by mouth daily.    [provider]  famotidine (PEPCID) 40 MG tablet Take 1 tablet by mouth daily. 02/26/19   [provider]  Multiple Vitamins-Minerals (PRESERVISION AREDS 2 PO) Take 1 tablet by mouth daily.    [provider]  simvastatin (ZOCOR) 20 MG tablet Take 20 mg by mouth at bedtime. 02/26/19   [provider]  tamsulosin (FLOMAX) 0.4 MG CAPS capsule Take 0.4 mg by mouth daily. 02/01/19   [provider]    Physical Exam:  Constitutional: Elderly male who appears to be in no acute distress at this time Vitals:  03/18/19 1107 03/18/19 1134 03/18/19 1145 03/18/19 1149  BP: 125/62 129/80 116/61 137/69  Pulse: 61 67 76 70  Resp: 19 (!) 21 (!) 21 15  Temp: 97.9 F (36.6 C) 98.2 F (36.8 C)  98.7 F (37.1 C)  TempSrc: Oral Oral  Oral  SpO2: 98% 99% 99%    Eyes: PERRL, lids and conjunctivae normal ENMT: Mucous membranes are moist. Posterior pharynx clear of any exudate or lesions.  Neck: normal, supple, no masses, no thyromegaly Respiratory:  clear to auscultation bilaterally, no wheezing, no crackles. Normal respiratory effort. No accessory muscle use.  Cardiovascular: Regular rate and rhythm, no murmurs / rubs / gallops. No extremity edema. 2+ pedal pulses. No carotid bruits.  Abdomen: no tenderness, no masses palpated. No hepatosplenomegaly. Bowel sounds positive.  Musculoskeletal: no clubbing / cyanosis. No joint deformity upper and lower extremities. Good ROM, no contractures. Normal muscle tone.  Skin: Pallor present.  No rashes, lesions, ulcers. No induration Neurologic: CN 2-12 grossly intact. Sensation intact, DTR normal. Strength 5/5 in all 4.  Psychiatric: Normal judgment and insight. Alert and oriented x 3. Normal mood.     Labs on Admission: I have personally reviewed following labs and imaging studies  CBC: Recent Labs  Lab 03/17/19 1538 03/18/19 0839  WBC 4.0 3.5*  NEUTROABS  --  2.0  HGB 4.2* 4.3*  HCT 16.6* 17.4*  MCV 67* 69.9*  PLT 397 331   Basic Metabolic Panel: Recent Labs  Lab 03/17/19 1538 03/18/19 0656  NA 140 139  K 4.8 4.2  CL 107* 108  CO2 20 21*  GLUCOSE 106* 111*  BUN 14 21  CREATININE 1.13 1.18  CALCIUM 8.9 9.1   GFR: Estimated Creatinine Clearance: 62.8 mL/min (by C-G formula based on SCr of 1.18 mg/dL). Liver Function Tests: Recent Labs  Lab 03/17/19 1538 03/18/19 0656  AST 11 16  ALT 10 15  ALKPHOS 78 66  BILITOT 0.4 0.8  PROT 6.4 7.1  ALBUMIN 4.5 4.4   No results for input(s): LIPASE, AMYLASE in the last 168 hours. No results for input(s): AMMONIA in the last 168 hours. Coagulation Profile: No results for input(s): INR, PROTIME in the last 168 hours. Cardiac Enzymes: No results for input(s): CKTOTAL, CKMB, CKMBINDEX, TROPONINI in the last 168 hours. BNP (last 3 results) Recent Labs    03/17/19 1538  PROBNP 614*   HbA1C: No results for input(s): HGBA1C in the last 72 hours. CBG: No results for input(s): GLUCAP in the last 168 hours. Lipid Profile: No  results for input(s): CHOL, HDL, LDLCALC, TRIG, CHOLHDL, LDLDIRECT in the last 72 hours. Thyroid Function Tests: No results for input(s): TSH, T4TOTAL, FREET4, T3FREE, THYROIDAB in the last 72 hours. Anemia Panel: Recent Labs    03/18/19 0839  VITAMINB12 128*  FOLATE 7.2  FERRITIN 3*  TIBC 543*  IRON 6*  RETICCTPCT 1.9   Urine analysis: No results found for: COLORURINE, APPEARANCEUR, LABSPEC, PHURINE, GLUCOSEU, HGBUR, BILIRUBINUR, KETONESUR, PROTEINUR, UROBILINOGEN, NITRITE, LEUKOCYTESUR Sepsis Labs: No results found for this or any previous visit (from the past 240 hour(s)).   Radiological Exams on Admission: No results found.  EKG: Independently reviewed.  Sinus rhythm 81 bpm  Assessment/Plan Iron deficiency anemia due to chronic blood loss, vitamin B12 deficiency: Acute.  Patient presents with hemoglobin 4.8 with low MCV and MCH.  Patient uses aspirin, but not on any other blood thinners.  Stool guaiacs were noted to be negative.  Anemia panel revealed iron  6, TIBC 543,  ferritin 3, and vitamin B12 128.  -Admit to a medical telemetry bed -Follow-up anemia panel -Clear liquid diet -Held aspirin -Continue with transfusion of 3 units of packed red blood cells -Give 20 mg of Lasix IV and continue to monitor for signs of fluid overload -Recheck H&H posttransfusion -Scheduled Feraheme transfusion in a.m. -Start Vitamin B12 1000 mcg daily -[facility] consulted, we will follow-up for further recommendations  Leukopenia: Acute.  WBC 3.5 on admission. -Continue to monitor  Cardiomegaly: Patient reported having a chest x-ray which showed cardiomegaly outpatient setting by his primary care provider.  He had been sent to Dr. Bjorn Pippin of cardiology who had wanted to obtain an echocardiogram, Doppler ultrasound of the lower extremities, and obtain a myocardial perfusion study. -Discussed with Dr.Schumann agreed that it was reasonable to go ahead and obtain  the Doppler ultrasound of the  lower extremities today and obtain the echocardiogram possibly tomorrow after blood has been given. -Follow-up Doppler ultrasound  of lower extremities and echocardiogram  GERD -Continue PPI  BPH -Continue Flomax  Hyperlipidemia: Home medications include simvastatin. -Continue Zocor   DVT prophylaxis:  SCDs Code Status: Full Family Communication: No family present at bedside. Disposition Plan: Possible discharge home in 1 to 2 days Consults called: [facility] Admission status: inpatient   Clydie Braun MD [facility] Pager [phone]   If 7PM-7AM, please contact night-coverage [email or web address] Password [employer]  03/18/2019, 12:15 PM

## 2019-03-19 ENCOUNTER — Telehealth (HOSPITAL_COMMUNITY): Payer: Self-pay

## 2019-03-19 ENCOUNTER — Ambulatory Visit (HOSPITAL_COMMUNITY): Payer: Federal, State, Local not specified - PPO

## 2019-03-19 ENCOUNTER — Ambulatory Visit (HOSPITAL_BASED_OUTPATIENT_CLINIC_OR_DEPARTMENT_OTHER): Payer: Federal, State, Local not specified - PPO

## 2019-03-19 DIAGNOSIS — R0600 Dyspnea, unspecified: Secondary | ICD-10-CM | POA: Diagnosis present

## 2019-03-19 DIAGNOSIS — E785 Hyperlipidemia, unspecified: Secondary | ICD-10-CM | POA: Diagnosis present

## 2019-03-19 DIAGNOSIS — D123 Benign neoplasm of transverse colon: Secondary | ICD-10-CM | POA: Diagnosis not present

## 2019-03-19 DIAGNOSIS — I371 Nonrheumatic pulmonary valve insufficiency: Secondary | ICD-10-CM

## 2019-03-19 DIAGNOSIS — I361 Nonrheumatic tricuspid (valve) insufficiency: Secondary | ICD-10-CM | POA: Diagnosis not present

## 2019-03-19 DIAGNOSIS — K3189 Other diseases of stomach and duodenum: Secondary | ICD-10-CM | POA: Diagnosis not present

## 2019-03-19 DIAGNOSIS — I517 Cardiomegaly: Secondary | ICD-10-CM | POA: Diagnosis not present

## 2019-03-19 DIAGNOSIS — E538 Deficiency of other specified B group vitamins: Secondary | ICD-10-CM | POA: Diagnosis present

## 2019-03-19 DIAGNOSIS — Q399 Congenital malformation of esophagus, unspecified: Secondary | ICD-10-CM | POA: Diagnosis not present

## 2019-03-19 DIAGNOSIS — I4891 Unspecified atrial fibrillation: Secondary | ICD-10-CM | POA: Diagnosis present

## 2019-03-19 DIAGNOSIS — I2699 Other pulmonary embolism without acute cor pulmonale: Secondary | ICD-10-CM | POA: Diagnosis not present

## 2019-03-19 DIAGNOSIS — Z20822 Contact with and (suspected) exposure to covid-19: Secondary | ICD-10-CM | POA: Diagnosis not present

## 2019-03-19 DIAGNOSIS — Z8249 Family history of ischemic heart disease and other diseases of the circulatory system: Secondary | ICD-10-CM | POA: Diagnosis not present

## 2019-03-19 DIAGNOSIS — D649 Anemia, unspecified: Secondary | ICD-10-CM | POA: Diagnosis present

## 2019-03-19 DIAGNOSIS — Z803 Family history of malignant neoplasm of breast: Secondary | ICD-10-CM | POA: Diagnosis not present

## 2019-03-19 DIAGNOSIS — K219 Gastro-esophageal reflux disease without esophagitis: Secondary | ICD-10-CM | POA: Diagnosis present

## 2019-03-19 DIAGNOSIS — Z7982 Long term (current) use of aspirin: Secondary | ICD-10-CM | POA: Diagnosis not present

## 2019-03-19 DIAGNOSIS — K449 Diaphragmatic hernia without obstruction or gangrene: Secondary | ICD-10-CM | POA: Diagnosis not present

## 2019-03-19 DIAGNOSIS — D500 Iron deficiency anemia secondary to blood loss (chronic): Secondary | ICD-10-CM | POA: Diagnosis not present

## 2019-03-19 DIAGNOSIS — R5383 Other fatigue: Secondary | ICD-10-CM | POA: Diagnosis not present

## 2019-03-19 DIAGNOSIS — N400 Enlarged prostate without lower urinary tract symptoms: Secondary | ICD-10-CM | POA: Diagnosis present

## 2019-03-19 DIAGNOSIS — I480 Paroxysmal atrial fibrillation: Secondary | ICD-10-CM | POA: Diagnosis not present

## 2019-03-19 DIAGNOSIS — R609 Edema, unspecified: Secondary | ICD-10-CM

## 2019-03-19 DIAGNOSIS — R001 Bradycardia, unspecified: Secondary | ICD-10-CM | POA: Diagnosis present

## 2019-03-19 DIAGNOSIS — I82452 Acute embolism and thrombosis of left peroneal vein: Secondary | ICD-10-CM | POA: Diagnosis not present

## 2019-03-19 DIAGNOSIS — K5730 Diverticulosis of large intestine without perforation or abscess without bleeding: Secondary | ICD-10-CM | POA: Diagnosis not present

## 2019-03-19 DIAGNOSIS — D509 Iron deficiency anemia, unspecified: Secondary | ICD-10-CM | POA: Diagnosis not present

## 2019-03-19 DIAGNOSIS — D72819 Decreased white blood cell count, unspecified: Secondary | ICD-10-CM | POA: Diagnosis present

## 2019-03-19 DIAGNOSIS — K635 Polyp of colon: Secondary | ICD-10-CM | POA: Diagnosis not present

## 2019-03-19 LAB — BASIC METABOLIC PANEL
Anion gap: 9 (ref 5–15)
BUN: 10 mg/dL (ref 8–23)
CO2: 24 mmol/L (ref 22–32)
Calcium: 8.9 mg/dL (ref 8.9–10.3)
Chloride: 106 mmol/L (ref 98–111)
Creatinine, Ser: 0.99 mg/dL (ref 0.61–1.24)
GFR calc Af Amer: >60 mL/min (ref >60)
GFR calc non Af Amer: >60 mL/min (ref >60)
Glucose, Bld: 80 mg/dL (ref 70–99)
Potassium: 3.6 mmol/L (ref 3.5–5.1)
Sodium: 139 mmol/L (ref 135–145)

## 2019-03-19 LAB — CBC
HCT: 28.6 % — ABNORMAL LOW (ref 39.0–52.0)
Hemoglobin: 8.6 g/dL — ABNORMAL LOW (ref 13.0–17.0)
MCH: 22.5 pg — ABNORMAL LOW (ref 26.0–34.0)
MCHC: 30.1 g/dL (ref 30.0–36.0)
MCV: 74.9 fL — ABNORMAL LOW (ref 80.0–100.0)
Platelets: 303 10*3/uL (ref 150–400)
RBC: 3.82 MIL/uL — ABNORMAL LOW (ref 4.22–5.81)
RDW: 21.7 % — ABNORMAL HIGH (ref 11.5–15.5)
WBC: 4.3 10*3/uL (ref 4.0–10.5)
nRBC: 0 % (ref 0.0–0.2)

## 2019-03-19 LAB — ECHOCARDIOGRAM COMPLETE
Height: 74 in
Weight: 3164.04 oz

## 2019-03-19 MED ORDER — CYANOCOBALAMIN 1000 MCG/ML IJ SOLN
1000.0000 ug | Freq: Once | INTRAMUSCULAR | Status: AC
  Administered 2019-03-19: 1000 ug via INTRAMUSCULAR
  Filled 2019-03-19: qty 1

## 2019-03-19 MED ORDER — SODIUM CHLORIDE 0.9 % IV SOLN: INTRAVENOUS | Status: DC

## 2019-03-19 MED ORDER — BISACODYL 10 MG RE SUPP
10.0000 mg | Freq: Once | RECTAL | Status: AC
  Administered 2019-03-19: 10 mg via RECTAL
  Filled 2019-03-19: qty 1

## 2019-03-19 MED ORDER — PEG-KCL-NACL-NASULF-NA ASC-C 100 G PO SOLR
0.5000 | Freq: Once | ORAL | Status: AC
  Administered 2019-03-19: 100 g via ORAL
  Filled 2019-03-19: qty 1

## 2019-03-19 MED ORDER — PEG-KCL-NACL-NASULF-NA ASC-C 100 G PO SOLR
0.5000 | Freq: Once | ORAL | Status: AC
  Administered 2019-03-20: 100 g via ORAL
  Filled 2019-03-19: qty 1

## 2019-03-19 NOTE — Progress Notes (Signed)
  Echocardiogram 2D Echocardiogram has been performed.  Joshua Estrada 03/19/2019, 10:41 AM

## 2019-03-19 NOTE — Telephone Encounter (Signed)
Encounter complete. 

## 2019-03-19 NOTE — Consult Note (Signed)
[facility] Consultation Note  Referring Provider: [facility] Primary Care Physician:  Johny Blamer, MD  Reason for Consultation:  Iron deficiency anemia  HPI: Joshua Estrada is a 79 y.o. male admitted for iron deficiency anemia.  Patient has several week history of insidious fatigue and exercise intolerance.  Found to have Hgb 4, and was admitted for further work-up.  He has no nausea, vomiting, appreciable dysphagia (occasional food going down "wrong way" for several years), hematemesis, overt blood in stool.  He Has occasional ill-defined abdominal pain and occasional bowel irregularity, but he's unsure when that really started.  Unsure if he's had any weight loss.  He denies ever having had endoscopy or colonoscopy.   Past Medical History:  Diagnosis Date  . BPH (benign prostatic hyperplasia)   . Hyperlipidemia     Past Surgical History:  Procedure Laterality Date  . Cataract surgery    . TONSILLECTOMY     As a child    Prior to Admission medications   Medication Sig Start Date End Date Taking? Authorizing Provider  aspirin EC 81 MG tablet Take 81 mg by mouth daily.   Yes [provider]  famotidine (PEPCID) 40 MG tablet Take 1 tablet by mouth 2 (two) times daily.  02/26/19  Yes [provider]  Multiple Vitamins-Minerals (PRESERVISION AREDS 2 PO) Take 1 tablet by mouth 2 (two) times daily.    Yes [provider]  simvastatin (ZOCOR) 20 MG tablet Take 20 mg by mouth at bedtime. 02/26/19  Yes [provider]  tamsulosin (FLOMAX) 0.4 MG CAPS capsule Take 0.4 mg by mouth at bedtime.  02/01/19  Yes [provider]    Current Facility-Administered Medications  Medication Dose Route Frequency Provider Last Rate Last Admin  . 0.9 %  sodium chloride infusion (Manually program via Guardrails IV Fluids)   Intravenous Once Gwyneth Sprout, MD   Stopped at 03/18/19 1136  . acetaminophen (TYLENOL) tablet 650 mg  650 mg Oral  Q6H PRN Clydie Braun, MD       Or  . acetaminophen (TYLENOL) suppository 650 mg  650 mg Rectal Q6H PRN Smith, Rondell A, MD      . albuterol (PROVENTIL) (2.5 MG/3ML) 0.083% nebulizer solution 2.5 mg  2.5 mg Nebulization Q6H PRN Smith, Rondell A, MD      . cyanocobalamin ((VITAMIN B-12)) injection 1,000 mcg  1,000 mcg Intramuscular Once Vann, Jessica U, DO      . famotidine (PEPCID) tablet 20 mg  20 mg Oral BID Madelyn Flavors A, MD   20 mg at 03/19/19 0959  . ondansetron (ZOFRAN) tablet 4 mg  4 mg Oral Q6H PRN Madelyn Flavors A, MD       Or  . ondansetron (ZOFRAN) injection 4 mg  4 mg Intravenous Q6H PRN Smith, Rondell A, MD      . simvastatin (ZOCOR) tablet 20 mg  20 mg Oral QHS Smith, Rondell A, MD   20 mg at 03/19/19 0015  . sodium chloride flush (NS) 0.9 % injection 3 mL  3 mL Intravenous Q12H Smith, Rondell A, MD   3 mL at 03/19/19 1002  . tamsulosin (FLOMAX) capsule 0.4 mg  0.4 mg Oral QHS Smith, Rondell A, MD   0.4 mg at 03/19/19 0015  . vitamin B-12 (CYANOCOBALAMIN) tablet 1,000 mcg  1,000 mcg Oral Daily Madelyn Flavors A, MD   1,000 mcg at 03/19/19 0959    Allergies as of 03/18/2019  . (No Known Allergies)  Family History  Problem Relation Age of Onset  . Breast cancer Mother   . Congestive Heart Failure Father     Social History   Socioeconomic History  . Marital status: Single    Spouse name: Not on file  . Number of children: Not on file  . Years of education: Not on file  . Highest education level: Not on file  Occupational History  . Not on file  Tobacco Use  . Smoking status: Never Smoker  . Smokeless tobacco: Never Used  Substance and Sexual Activity  . Alcohol use: Never  . Drug use: Never  . Sexual activity: Not on file  Other Topics Concern  . Not on file  Social History Narrative  . Not on file   Social Determinants of Health   Financial Resource Strain:   . Difficulty of Paying Living Expenses: Not on file  Food Insecurity:   . Worried About  Programme researcher, broadcasting/film/video in the Last Year: Not on file  . Ran Out of Food in the Last Year: Not on file  Transportation Needs:   . Lack of Transportation (Medical): Not on file  . Lack of Transportation (Non-Medical): Not on file  Physical Activity:   . Days of Exercise per Week: Not on file  . Minutes of Exercise per Session: Not on file  Stress:   . Feeling of Stress : Not on file  Social Connections:   . Frequency of Communication with Friends and Family: Not on file  . Frequency of Social Gatherings with Friends and Family: Not on file  . Attends Religious Services: Not on file  . Active Member of Clubs or Organizations: Not on file  . Attends Banker Meetings: Not on file  . Marital Status: Not on file  Intimate Partner Violence:   . Fear of Current or Ex-Partner: Not on file  . Emotionally Abused: Not on file  . Physically Abused: Not on file  . Sexually Abused: Not on file    Review of Systems: as per HPI, all others negative  Physical Exam: Vital signs in last 24 hours: Temp:  [97.5 F (36.4 C)-98.7 F (37.1 C)] 98.5 F (36.9 C) (01/14 0900) Pulse Rate:  [38-76] 51 (01/14 0900) Resp:  [10-26] 16 (01/14 0900) BP: (94-137)/(49-80) 121/60 (01/14 0900) SpO2:  [96 %-100 %] 100 % (01/14 0900) Weight:  [89.7 kg] 89.7 kg (01/14 0107) Last BM Date: 03/16/19(Doesnt remember for sure ) General:   Alert, thin, tall, mildly cachectic-appearing, pleasant and cooperative in NAD Head:  Normocephalic and atraumatic. Eyes:  Sclera clear, no icterus.   Conjunctiva pink. Ears:  Normal auditory acuity. Nose:  No deformity, discharge,  or lesions. Mouth:  No deformity or lesions.  Oropharynx pink & moist. Neck:  Supple; no masses or thyromegaly. Lungs:  Clear throughout to auscultation.   No wheezes, crackles, or rhonchi. No acute distress. Heart:  Regular rate and rhythm; no murmurs, clicks, rubs,  or gallops. Abdomen:  Soft, nontender and nondistended. No masses,  hepatosplenomegaly or hernias noted. Normal bowel sounds, without guarding, and without rebound.     Msk:  Symmetrical without gross deformities. Normal posture. Pulses:  Normal pulses noted. Extremities:  Without clubbing or edema. Neurologic:  Alert and  oriented x4;  grossly normal neurologically. Skin:  Pale, otherwise intact without significant lesions or rashes. Psych:  Alert and cooperative. Normal mood and affect.   Lab Results: Recent Labs    03/17/19 1538 03/18/19 0839 03/18/19 1728  03/18/19 1952  WBC 4.0 3.5*  --   --   HGB 4.2* 4.3* 7.0* 6.7*  HCT 16.6* 17.4* 23.8* 23.1*  PLT 397 331  --   --    BMET Recent Labs    03/17/19 1538 03/18/19 0656  NA 140 139  K 4.8 4.2  CL 107* 108  CO2 20 21*  GLUCOSE 106* 111*  BUN 14 21  CREATININE 1.13 1.18  CALCIUM 8.9 9.1   LFT Recent Labs    03/18/19 0656  PROT 7.1  ALBUMIN 4.4  AST 16  ALT 15  ALKPHOS 66  BILITOT 0.8   PT/INR Recent Labs    03/18/19 1724  LABPROT 14.4  INR 1.1    Studies/Results: No results found.  Impression:  1.  Iron deficiency anemia, profound. No overt bleeding. 2.  Leukopenia, mild. 3.  Fatigue and weakness, likely from #1 above, improving after blood transfusion.  Plan:  1.  Continue supportive care including volume repletion and ongoing blood transfusion. 2.  Colonoscopy tomorrow, which if negative would be followed by endoscopy under same sedation. 3.  Risks (bleeding, infection, bowel perforation that could require surgery, sedation-related changes in cardiopulmonary systems), benefits (identification and possible treatment of source of symptoms, exclusion of certain causes of symptoms), and alternatives (watchful waiting, radiographic imaging studies, empiric medical treatment) of colonoscopy were explained to patient/family in detail and patient wishes to proceed. 4.  Risks (bleeding, infection, bowel perforation that could require surgery, sedation-related changes in  cardiopulmonary systems), benefits (identification and possible treatment of source of symptoms, exclusion of certain causes of symptoms), and alternatives (watchful waiting, radiographic imaging studies, empiric medical treatment) of upper endoscopy (EGD) were explained to patient/family in detail and patient wishes to proceed. 5.  Clear liquid now, NPO after midnight. 6.  Next step in management pending endoscopic findings tomorrow.   LOS: 0 days   Lovie Agresta M  03/19/2019, 10:22 AM  Cell [phone] If no answer or after 5 PM call [phone]

## 2019-03-19 NOTE — Plan of Care (Signed)
  Problem: Education: Goal: Knowledge of General Education information will improve Description Including pain rating scale, medication(s)/side effects and non-pharmacologic comfort measures Outcome: Progressing   Problem: Activity: Goal: Risk for activity intolerance will decrease Outcome: Progressing   Problem: Safety: Goal: Ability to remain free from injury will improve Outcome: Progressing   

## 2019-03-19 NOTE — Plan of Care (Signed)

## 2019-03-19 NOTE — Progress Notes (Signed)
Progress Note    Joshua Estrada  MCR:[id number] DOB: 1940-12-23  DOA: 03/18/2019 PCP: Shirline Frees, MD    Brief Narrative:    Medical records reviewed and are as summarized below:  Joshua Estrada is an 79 y.o. male with medical history significant of hyperlipidemia; who presents after being told that he had an low blood counts.  Patient had followed up with cardiology after his primary care provider had obtained a chest x-ray which noted cardiomegaly.  Over the last 2 months he had been experiencing progressive exertional dyspnea.  Complains of being unable to walk or do anything without needing to rest.  Associated symptoms include lower extremity swelling.  He has not had any dark or bloody stools to his knowledge.  Patient takes a daily aspirin when he remember, but is not on any other anticoagulation.  Assessment/Plan:   Principal Problem:   Iron deficiency anemia due to chronic blood loss Active Problems:   Leukopenia   Cardiomegaly   GERD (gastroesophageal reflux disease)   BPH (benign prostatic hyperplasia)   Hyperlipidemia   Severe anemia   Iron deficiency anemia due to chronic blood loss, vitamin B12 deficiency: Acute.  Patient presents with hemoglobin 4.8 with low MCV and MCH.   -Patient uses aspirin, but not on any other blood thinners.  Stool guaiacs were noted to be negative.  Anemia panel revealed iron  6, TIBC 543,  ferritin 3, and vitamin B12 128.  -Clear liquid diet, NPO After midnight  -Held aspirin - 3-5 units of packed red blood cells- documentation not clear -Feraheme transfusion -IM b12 x 1 dose and then Vitamin B12 1000 mcg daily - GI consulted: plan for colonoscopy in the AM -denies alcohol use  Incidental left leg DVT -distal (below the knee) -will hold on anticoagulation for now but suspect will need to treat after source of anemia found as this DVT appears to be unprovoked (this was based on the prelim read, will follow up on the official read) -may  need CT scan to r/o PE to help with treatment plan  Cardiomegaly: Patient reported having a chest x-ray which showed cardiomegaly outpatient setting by his primary care provider.  He had been sent to Dr. Gardiner Rhyme of cardiology who had wanted to obtain an echocardiogram, Doppler ultrasound of the lower extremities, and obtain a myocardial perfusion study. -echocardiogram per cardiology  GERD -Continue PPI  BPH -Continue Flomax  Hyperlipidemia: Home medications include simvastatin. -Continue Zocor   Family Communication/Anticipated D/C date and plan/Code Status   DVT prophylaxis: Code Status: Full Code.  Family Communication:  Disposition Plan: pending work up   Medical Consultants:    gi.    Subjective:   Feels better after PRBCs  Objective:    Vitals:   03/19/19 0452 03/19/19 0729 03/19/19 0900 03/19/19 1426  BP: 119/60 131/61 121/60 99/65  Pulse: (!) 56 63 (!) 51 (!) 52  Resp: 16 15 16 18   Temp: 98.6 F (37 C) 98.2 F (36.8 C) 98.5 F (36.9 C) 98.4 F (36.9 C)  TempSrc: Oral Oral Oral Oral  SpO2: 98% 98% 100% 95%  Weight:      Height:        Intake/Output Summary (Last 24 hours) at 03/19/2019 1503 Last data filed at 03/19/2019 0440 Gross per 24 hour  Intake 1274.67 ml  Output 1750 ml  Net -475.33 ml   Filed Weights   03/19/19 0107  Weight: 89.7 kg    Exam: In bed, NAD rrr No increased  work of breathing A+Ox3  Data Reviewed:   I have personally reviewed following labs and imaging studies:  Labs: Labs show the following:   Basic Metabolic Panel: Recent Labs  Lab 03/17/19 1538 03/17/19 1538 03/18/19 0656 03/19/19 1040  NA 140  --  139 139  K 4.8   < > 4.2 3.6  CL 107*  --  108 106  CO2 20  --  21* 24  GLUCOSE 106*  --  111* 80  BUN 14  --  21 10  CREATININE 1.13  --  1.18 0.99  CALCIUM 8.9  --  9.1 8.9   < > = values in this interval not displayed.   GFR Estimated Creatinine Clearance: 73.8 mL/min (by C-G formula based on  SCr of 0.99 mg/dL). Liver Function Tests: Recent Labs  Lab 03/17/19 1538 03/18/19 0656  AST 11 16  ALT 10 15  ALKPHOS 78 66  BILITOT 0.4 0.8  PROT 6.4 7.1  ALBUMIN 4.5 4.4   No results for input(s): LIPASE, AMYLASE in the last 168 hours. No results for input(s): AMMONIA in the last 168 hours. Coagulation profile Recent Labs  Lab 03/18/19 1724  INR 1.1    CBC: Recent Labs  Lab 03/17/19 1538 03/18/19 0839 03/18/19 1728 03/18/19 1952 03/19/19 1040  WBC 4.0 3.5*  --   --  4.3  NEUTROABS  --  2.0  --   --   --   HGB 4.2* 4.3* 7.0* 6.7* 8.6*  HCT 16.6* 17.4* 23.8* 23.1* 28.6*  MCV 67* 69.9*  --   --  74.9*  PLT 397 331  --   --  303   Cardiac Enzymes: No results for input(s): CKTOTAL, CKMB, CKMBINDEX, TROPONINI in the last 168 hours. BNP (last 3 results) Recent Labs    03/17/19 1538  PROBNP 614*   CBG: No results for input(s): GLUCAP in the last 168 hours. D-Dimer: No results for input(s): DDIMER in the last 72 hours. Hgb A1c: No results for input(s): HGBA1C in the last 72 hours. Lipid Profile: No results for input(s): CHOL, HDL, LDLCALC, TRIG, CHOLHDL, LDLDIRECT in the last 72 hours. Thyroid function studies: Recent Labs    03/18/19 1724  TSH 1.383   Anemia work up: Recent Labs    03/18/19 0839  VITAMINB12 128*  FOLATE 7.2  FERRITIN 3*  TIBC 543*  IRON 6*  RETICCTPCT 1.9   Sepsis Labs: Recent Labs  Lab 03/17/19 1538 03/18/19 0839 03/19/19 1040  WBC 4.0 3.5* 4.3    Microbiology Recent Results (from the past 240 hour(s))  SARS CORONAVIRUS 2 (TAT 6-24 HRS) Nasopharyngeal Nasopharyngeal Swab     Status: None   Collection Time: 03/18/19  8:39 AM   Specimen: Nasopharyngeal Swab  Result Value Ref Range Status   SARS Coronavirus 2 NEGATIVE NEGATIVE Final    Comment: (NOTE) SARS-CoV-2 target nucleic acids are NOT DETECTED. The SARS-CoV-2 RNA is generally detectable in upper and lower respiratory specimens during the acute phase of infection.  Negative results do not preclude SARS-CoV-2 infection, do not rule out co-infections with other pathogens, and should not be used as the sole basis for treatment or other patient management decisions. Negative results must be combined with clinical observations, patient history, and epidemiological information. The expected result is Negative. Fact Sheet for Patients: SugarRoll.be Fact Sheet for Healthcare Providers: https://www.woods-mathews.com/ This test is not yet approved or cleared by the [place] FDA and  has been authorized for detection and/or diagnosis of SARS-CoV-2 by  FDA under an Emergency Use Authorization (EUA). This EUA will remain  in effect (meaning this test can be used) for the duration of the COVID-19 declaration under Section 56 4(b)(1) of the Act, 21 U.S.C. section 360bbb-3(b)(1), unless the authorization is terminated or revoked sooner. Performed at [facility], [place] [address] [place], [place] [address]     Procedures and diagnostic studies:  ECHOCARDIOGRAM COMPLETE  Result Date: 03/19/2019   ECHOCARDIOGRAM REPORT   Patient Name:   Joshua Estrada Date of Exam: 03/19/2019 Medical Rec #:  [id number]  Height:       74.0 in Accession #:    [id number] Weight:       197.8 lb Date of Birth:  [birth year]  BSA:          2.16 m Patient Age:    78 years   BP:           121/60 mmHg Patient Gender: M          HR:           63 bpm. Exam Location:  Inpatient Procedure: 2D Echo, Cardiac Doppler and Color Doppler Indications:    I51.7 Cardiomegaly  History:        Patient has no prior history of Echocardiogram examinations.                 Cardiomegaly; Risk Factors:Dyslipidemia.  Sonographer:    Roseanna Rainbow RDCS Referring Phys: [id number] [facility]  Sonographer Comments: Suboptimal subcostal window. Spinal curvature. IMPRESSIONS  1. Left ventricular ejection fraction, by visual estimation, is 55 to 60%. The left ventricle has normal  function. Left ventricular septal wall thickness was mildly increased. Mildly increased left ventricular posterior wall thickness. There is mildly increased left ventricular hypertrophy.  2. Left ventricular diastolic parameters are indeterminate.  3. The left ventricle has no regional wall motion abnormalities.  4. Global right ventricle has normal systolic function.The right ventricular size is normal. No increase in right ventricular wall thickness.  5. Left atrial size was severely dilated.  6. Right atrial size was moderately dilated.  7. The mitral valve is normal in structure. Trivial mitral valve regurgitation. No evidence of mitral stenosis.  8. The tricuspid valve is normal in structure.  9. The aortic valve is normal in structure. Aortic valve regurgitation is not visualized. No evidence of aortic valve sclerosis or stenosis. 10. Pulmonic regurgitation is mild. 11. The pulmonic valve was normal in structure. Pulmonic valve regurgitation is mild. 12. Normal pulmonary artery systolic pressure. 13. The inferior vena cava is normal in size with greater than 50% respiratory variability, suggesting right atrial pressure of 3 mmHg. FINDINGS  Left Ventricle: Left ventricular ejection fraction, by visual estimation, is 55 to 60%. The left ventricle has normal function. The left ventricle has no regional wall motion abnormalities. Mildly increased left ventricular posterior wall thickness. There is mildly increased left ventricular hypertrophy. Left ventricular diastolic parameters are indeterminate. Normal left atrial pressure. Right Ventricle: The right ventricular size is normal. No increase in right ventricular wall thickness. Global RV systolic function is has normal systolic function. The tricuspid regurgitant velocity is 1.42 m/s, and with an assumed right atrial pressure  of 3 mmHg, the estimated right ventricular systolic pressure is normal at 11.1 mmHg. Left Atrium: Left atrial size was severely dilated.  Right Atrium: Right atrial size was moderately dilated Pericardium: There is no evidence of pericardial effusion. Mitral Valve: The mitral valve is normal in structure. Trivial mitral valve  regurgitation. No evidence of mitral valve stenosis by observation. Tricuspid Valve: The tricuspid valve is normal in structure. Tricuspid valve regurgitation is mild. Aortic Valve: The aortic valve is normal in structure. Aortic valve regurgitation is not visualized. The aortic valve is structurally normal, with no evidence of sclerosis or stenosis. Pulmonic Valve: The pulmonic valve was normal in structure. Pulmonic valve regurgitation is mild. Pulmonic regurgitation is mild. Aorta: The aortic root, ascending aorta and aortic arch are all structurally normal, with no evidence of dilitation or obstruction. Venous: The inferior vena cava is normal in size with greater than 50% respiratory variability, suggesting right atrial pressure of 3 mmHg. IAS/Shunts: No atrial level shunt detected by color flow Doppler. There is no evidence of a patent foramen ovale. No ventricular septal defect is seen or detected. There is no evidence of an atrial septal defect.  LEFT VENTRICLE PLAX 2D LVIDd:         4.52 cm LVIDs:         3.17 cm LV PW:         1.27 cm LV IVS:        1.19 cm LVOT diam:     2.20 cm LV SV:         53 ml LV SV Index:   24.59 LVOT Area:     3.80 cm  LV Volumes (MOD) LV area d, A2C:    32.60 cm LV area d, A4C:    31.90 cm LV area s, A2C:    20.40 cm LV area s, A4C:    18.10 cm LV major d, A2C:   7.79 cm LV major d, A4C:   7.77 cm LV major s, A2C:   6.71 cm LV major s, A4C:   6.04 cm LV vol d, MOD A2C: 108.0 ml LV vol d, MOD A4C: 107.0 ml LV vol s, MOD A2C: 51.3 ml LV vol s, MOD A4C: 46.3 ml LV SV MOD A2C:     56.7 ml LV SV MOD A4C:     107.0 ml LV SV MOD BP:      56.1 ml RIGHT VENTRICLE         IVC TAPSE (M-mode): 2.6 cm  IVC diam: 1.81 cm LEFT ATRIUM             Index       RIGHT ATRIUM           Index LA diam:         4.90 cm 2.27 cm/m  RA Area:     25.00 cm LA Vol (A2C):   67.6 ml 31.25 ml/m RA Volume:   78.30 ml  36.20 ml/m LA Vol (A4C):   99.0 ml 45.77 ml/m LA Biplane Vol: 84.3 ml 38.97 ml/m  AORTIC VALVE LVOT Vmax:   133.00 cm/s LVOT Vmean:  79.400 cm/s LVOT VTI:    0.232 m  AORTA Ao Root diam: 3.60 cm Ao Asc diam:  3.60 cm MITRAL VALVE                        TRICUSPID VALVE MV Area (PHT): 3.72 cm             TR Peak grad:   8.1 mmHg MV PHT:        59.16 msec           TR Vmax:        142.00 cm/s MV Decel Time: 204 msec MV E velocity: 87.80 cm/s  103 cm/s  SHUNTS MV A velocity: 62.10 cm/s 70.3 cm/s Systemic VTI:  0.23 m MV E/A ratio:  1.41       1.5       Systemic Diam: 2.20 cm  Skeet Latch MD Electronically signed by Skeet Latch MD Signature Date/Time: 03/19/2019/2:39:57 PM    Final    VAS [place] LOWER EXTREMITY VENOUS (DVT)  Result Date: 03/19/2019  Lower Venous Study Indications: Edema.  Risk Factors: None identified. Comparison Study: No prior studies. Performing Technologist: Oliver Hum RVT  Examination Guidelines: A complete evaluation includes B-mode imaging, spectral Doppler, color Doppler, and power Doppler as needed of all accessible portions of each vessel. Bilateral testing is considered an integral part of a complete examination. Limited examinations for reoccurring indications may be performed as noted.  +---------+---------------+---------+-----------+----------+--------------+ RIGHT    CompressibilityPhasicitySpontaneityPropertiesThrombus Aging +---------+---------------+---------+-----------+----------+--------------+ CFV      Full           Yes      Yes                                 +---------+---------------+---------+-----------+----------+--------------+ SFJ      Full                                                        +---------+---------------+---------+-----------+----------+--------------+ FV Prox  Full                                                         +---------+---------------+---------+-----------+----------+--------------+ FV Mid   Full                                                        +---------+---------------+---------+-----------+----------+--------------+ FV DistalFull                                                        +---------+---------------+---------+-----------+----------+--------------+ PFV      Full                                                        +---------+---------------+---------+-----------+----------+--------------+ POP      Full           Yes      Yes                                 +---------+---------------+---------+-----------+----------+--------------+ PTV      Full                                                        +---------+---------------+---------+-----------+----------+--------------+  PERO     Full                                                        +---------+---------------+---------+-----------+----------+--------------+   +---------+---------------+---------+-----------+----------+-----------------+ LEFT     CompressibilityPhasicitySpontaneityPropertiesThrombus Aging    +---------+---------------+---------+-----------+----------+-----------------+ CFV      Full           Yes      Yes                                    +---------+---------------+---------+-----------+----------+-----------------+ SFJ      Full                                                           +---------+---------------+---------+-----------+----------+-----------------+ FV Prox  Full                                                           +---------+---------------+---------+-----------+----------+-----------------+ FV Mid   Full                                                           +---------+---------------+---------+-----------+----------+-----------------+ FV DistalFull                                                            +---------+---------------+---------+-----------+----------+-----------------+ PFV      Full                                                           +---------+---------------+---------+-----------+----------+-----------------+ POP      Full           Yes      Yes                                    +---------+---------------+---------+-----------+----------+-----------------+ PTV      Full                                                           +---------+---------------+---------+-----------+----------+-----------------+ PERO     None  Age Indeterminate +---------+---------------+---------+-----------+----------+-----------------+     Summary: Right: There is no evidence of deep vein thrombosis in the lower extremity. No cystic structure found in the popliteal fossa. 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 No cystic structure found in the popliteal fossa.  *See table(s) above for measurements and observations.    Preliminary     Medications:   . sodium chloride   Intravenous Once  . bisacodyl  10 mg Rectal Once  . famotidine  20 mg Oral BID  . peg 3350 powder  0.5 kit Oral Once  . simvastatin  20 mg Oral QHS  . sodium chloride flush  3 mL Intravenous Q12H  . tamsulosin  0.4 mg Oral QHS  . vitamin B-12  1,000 mcg Oral Daily   Continuous Infusions:   LOS: 0 days   Geradine Girt  [facility]   How to contact the [facility] Attending or Consulting provider [place] or covering provider during after hours [place], for this patient?  1. Check the care team in [facility] and look for a) attending/consulting [facility] provider listed and b) the [facility] team listed 2. Log into [email or web address] and use [place]'s universal password to access. If you do not have the password, please contact the hospital operator. 3. Locate the [facility] provider you are looking for under [facility]  and page to a number that you can be directly reached. 4. If you still have difficulty reaching the provider, please page the [facility] (Director on Call) for the Hospitalists listed on amion for assistance.  03/19/2019, 3:03 PM

## 2019-03-19 NOTE — Progress Notes (Signed)
Bilateral lower extremity venous duplex has been completed. Preliminary results can be found in CV Proc through chart review.  Results were given to the patient's nurse, [place]. 03/19/19 11:37 AM Olen Cordial RVT

## 2019-03-20 ENCOUNTER — Inpatient Hospital Stay (HOSPITAL_COMMUNITY): Admission: RE | Admit: 2019-03-20 | Payer: Federal, State, Local not specified - PPO | Source: Ambulatory Visit

## 2019-03-20 LAB — TYPE AND SCREEN
ABO/RH(D): AB POS
Antibody Screen: NEGATIVE
Unit division: 0
Unit division: 0
Unit division: 0
Unit division: 0
Unit division: 0

## 2019-03-20 LAB — BPAM RBC
Blood Product Expiration Date: 202101262359
Blood Product Expiration Date: 202101312359
Blood Product Expiration Date: 202102032359
Blood Product Expiration Date: 202102032359
Blood Product Expiration Date: 202102042359
ISSUE DATE / TIME: 202101130900
ISSUE DATE / TIME: 202101131121
ISSUE DATE / TIME: 202101131433
ISSUE DATE / TIME: 202101140041
ISSUE DATE / TIME: 202101140420
Unit Type and Rh: 6200
Unit Type and Rh: 6200
Unit Type and Rh: 6200
Unit Type and Rh: 8400
Unit Type and Rh: 8400

## 2019-03-20 LAB — CBC
HCT: 31.7 % — ABNORMAL LOW (ref 39.0–52.0)
Hemoglobin: 9.3 g/dL — ABNORMAL LOW (ref 13.0–17.0)
MCH: 22.7 pg — ABNORMAL LOW (ref 26.0–34.0)
MCHC: 29.3 g/dL — ABNORMAL LOW (ref 30.0–36.0)
MCV: 77.5 fL — ABNORMAL LOW (ref 80.0–100.0)
Platelets: 319 10*3/uL (ref 150–400)
RBC: 4.09 MIL/uL — ABNORMAL LOW (ref 4.22–5.81)
RDW: 22.5 % — ABNORMAL HIGH (ref 11.5–15.5)
WBC: 4.9 10*3/uL (ref 4.0–10.5)
nRBC: 0 % (ref 0.0–0.2)

## 2019-03-20 LAB — BASIC METABOLIC PANEL
Anion gap: 8 (ref 5–15)
BUN: 7 mg/dL — ABNORMAL LOW (ref 8–23)
CO2: 24 mmol/L (ref 22–32)
Calcium: 8.8 mg/dL — ABNORMAL LOW (ref 8.9–10.3)
Chloride: 110 mmol/L (ref 98–111)
Creatinine, Ser: 1.06 mg/dL (ref 0.61–1.24)
GFR calc Af Amer: >60 mL/min (ref >60)
GFR calc non Af Amer: >60 mL/min (ref >60)
Glucose, Bld: 97 mg/dL (ref 70–99)
Potassium: 3.8 mmol/L (ref 3.5–5.1)
Sodium: 142 mmol/L (ref 135–145)

## 2019-03-20 MED ORDER — PEG 3350-KCL-NA BICARB-NACL 420 G PO SOLR
4000.0000 mL | Freq: Once | ORAL | Status: AC
  Administered 2019-03-20: 4000 mL via ORAL
  Filled 2019-03-20: qty 4000

## 2019-03-20 MED ORDER — BISACODYL 10 MG RE SUPP
10.0000 mg | Freq: Once | RECTAL | Status: AC
  Administered 2019-03-20: 10 mg via RECTAL
  Filled 2019-03-20: qty 1

## 2019-03-20 MED ORDER — PEG 3350-KCL-NA BICARB-NACL 420 G PO SOLR
4000.0000 mL | Freq: Once | ORAL | Status: DC
  Filled 2019-03-20: qty 4000

## 2019-03-20 NOTE — Progress Notes (Signed)
Progress Note    Joshua Estrada  UJW:[id number] DOB: 17-Jan-1941  DOA: 03/18/2019 PCP: Johny Blamer, MD    Brief Narrative:    Medical records reviewed and are as summarized below:  Joshua Estrada is an 79 y.o. male with medical history significant of hyperlipidemia; who presents after being told that he had an low blood counts.  Patient had followed up with cardiology after his primary care provider had obtained a chest x-ray which noted cardiomegaly.  Over the last 2 months he had been experiencing progressive exertional dyspnea.  Complains of being unable to walk or do anything without needing to rest.  Associated symptoms include lower extremity swelling.  He has not had any dark or bloody stools to his knowledge.  Patient takes a daily aspirin when he remembers, but is not on any other anticoagulation.  Assessment/Plan:   Principal Problem:   Iron deficiency anemia due to chronic blood loss Active Problems:   Leukopenia   Cardiomegaly   GERD (gastroesophageal reflux disease)   BPH (benign prostatic hyperplasia)   Hyperlipidemia   Severe anemia   Iron deficiency anemia due to chronic blood loss, vitamin B12 deficiency: Acute.  Patient presents with hemoglobin 4.8 with low MCV and MCH.   -Patient uses aspirin, but not on any other blood thinners.  Stool guaiacs were noted to be negative.  Anemia panel revealed iron  6, TIBC 543,  ferritin 3, and vitamin B12 128.  -Clear liquid diet, NPO After midnight -- not sure prep has been finished -Held aspirin - 3-5 units of packed red blood cells- documentation not clear -Feraheme transfusion x 1 -IM b12 x 1 dose and then Vitamin B12 1000 mcg daily - GI consulted: plan for colonoscopy when prep complete -denies alcohol use  Incidental left leg DVT -distal (below the knee)-- appears to be age indeterminate  -will hold on anticoagulation for now but may need to treat after source of anemia found as this DVT appears to be unprovoked (this  was based on the prelim read, will follow up on the official read) -may need CT scan to r/o PE to help with treatment plan  Cardiomegaly: Patient reported having a chest x-ray which showed cardiomegaly outpatient setting by his primary care provider.  He had been sent to Dr. Bjorn Pippin of cardiology who had wanted to obtain an echocardiogram, Doppler ultrasound of the lower extremities, and obtain a myocardial perfusion study. -echocardiogram: Left ventricular ejection fraction, by visual estimation, is 55 to 60%. The left ventricle has normal function. Left ventricular septal wall thickness was mildly increased. Mildly increased left ventricular posterior wall thickness. There is mildly  increased left ventricular hypertrophy.  GERD -Continue PPI  BPH -Continue Flomax  Hyperlipidemia: Home medications include simvastatin. -Continue Zocor   Family Communication/Anticipated D/C date and plan/Code Status   DVT prophylaxis: Code Status: Full Code.  Family Communication:  Disposition Plan: pending work up-- needs colonoscopy and then consideration of anticoagulation for DVT (may be old)   Medical Consultants:    gi.    Subjective:    Nursing reports stools are not yet clear  Objective:    Vitals:   03/19/19 1426 03/19/19 2028 03/20/19 0316 03/20/19 0909  BP: 99/65 123/61 (!) 108/54 131/70  Pulse: (!) 52 (!) 56 (!) 52 (!) 51  Resp: 18 19 18 17   Temp: 98.4 F (36.9 C) 97.8 F (36.6 C) 97.8 F (36.6 C) 97.6 F (36.4 C)  TempSrc: Oral   Oral  SpO2: 95% 96%  97% 99%  Weight:      Height:        Intake/Output Summary (Last 24 hours) at 03/20/2019 1031 Last data filed at 03/19/2019 1802 Gross per 24 hour  Intake 250 ml  Output --  Net 250 ml   Filed Weights   03/19/19 0107  Weight: 89.7 kg    Exam: In bed, NAD rrr Pleasant and cooperative A+Ox3  Data Reviewed:   I have personally reviewed following labs and imaging studies:  Labs: Labs show the  following:   Basic Metabolic Panel: Recent Labs  Lab 03/17/19 1538 03/17/19 1538 03/18/19 0656 03/18/19 0656 03/19/19 1040 03/20/19 0250  NA 140  --  139  --  139 142  K 4.8   < > 4.2   < > 3.6 3.8  CL 107*  --  108  --  106 110  CO2 20  --  21*  --  24 24  GLUCOSE 106*  --  111*  --  80 97  BUN 14  --  21  --  10 7*  CREATININE 1.13  --  1.18  --  0.99 1.06  CALCIUM 8.9  --  9.1  --  8.9 8.8*   < > = values in this interval not displayed.   GFR Estimated Creatinine Clearance: 68.9 mL/min (by C-G formula based on SCr of 1.06 mg/dL). Liver Function Tests: Recent Labs  Lab 03/17/19 1538 03/18/19 0656  AST 11 16  ALT 10 15  ALKPHOS 78 66  BILITOT 0.4 0.8  PROT 6.4 7.1  ALBUMIN 4.5 4.4   No results for input(s): LIPASE, AMYLASE in the last 168 hours. No results for input(s): AMMONIA in the last 168 hours. Coagulation profile Recent Labs  Lab 03/18/19 1724  INR 1.1    CBC: Recent Labs  Lab 03/17/19 1538 03/18/19 0839 03/18/19 1728 03/18/19 1952 03/19/19 1040 03/20/19 0250  WBC 4.0 3.5*  --   --  4.3 4.9  NEUTROABS  --  2.0  --   --   --   --   HGB 4.2* 4.3* 7.0* 6.7* 8.6* 9.3*  HCT 16.6* 17.4* 23.8* 23.1* 28.6* 31.7*  MCV 67* 69.9*  --   --  74.9* 77.5*  PLT 397 331  --   --  303 319   Cardiac Enzymes: No results for input(s): CKTOTAL, CKMB, CKMBINDEX, TROPONINI in the last 168 hours. BNP (last 3 results) Recent Labs    03/17/19 1538  PROBNP 614*   CBG: No results for input(s): GLUCAP in the last 168 hours. D-Dimer: No results for input(s): DDIMER in the last 72 hours. Hgb A1c: No results for input(s): HGBA1C in the last 72 hours. Lipid Profile: No results for input(s): CHOL, HDL, LDLCALC, TRIG, CHOLHDL, LDLDIRECT in the last 72 hours. Thyroid function studies: Recent Labs    03/18/19 1724  TSH 1.383   Anemia work up: Recent Labs    03/18/19 0839  VITAMINB12 128*  FOLATE 7.2  FERRITIN 3*  TIBC 543*  IRON 6*  RETICCTPCT 1.9    Sepsis Labs: Recent Labs  Lab 03/17/19 1538 03/18/19 0839 03/19/19 1040 03/20/19 0250  WBC 4.0 3.5* 4.3 4.9    Microbiology Recent Results (from the past 240 hour(s))  SARS CORONAVIRUS 2 (TAT 6-24 HRS) Nasopharyngeal Nasopharyngeal Swab     Status: None   Collection Time: 03/18/19  8:39 AM   Specimen: Nasopharyngeal Swab  Result Value Ref Range Status   SARS Coronavirus 2 NEGATIVE NEGATIVE  Final    Comment: (NOTE) SARS-CoV-2 target nucleic acids are NOT DETECTED. The SARS-CoV-2 RNA is generally detectable in upper and lower respiratory specimens during the acute phase of infection. Negative results do not preclude SARS-CoV-2 infection, do not rule out co-infections with other pathogens, and should not be used as the sole basis for treatment or other patient management decisions. Negative results must be combined with clinical observations, patient history, and epidemiological information. The expected result is Negative. Fact Sheet for Patients: HairSlick.no Fact Sheet for Healthcare Providers: quierodirigir.com This test is not yet approved or cleared by the [place] FDA and  has been authorized for detection and/or diagnosis of SARS-CoV-2 by FDA under an Emergency Use Authorization (EUA). This EUA will remain  in effect (meaning this test can be used) for the duration of the COVID-19 declaration under Section 56 4(b)(1) of the Act, 21 U.S.C. section 360bbb-3(b)(1), unless the authorization is terminated or revoked sooner. Performed at [facility], [address] [place], [place] [address]     Procedures and diagnostic studies:  ECHOCARDIOGRAM COMPLETE  Result Date: 03/19/2019   ECHOCARDIOGRAM REPORT   Patient Name:   Biruk Portugal Date of Exam: 03/19/2019 Medical Rec #:  [id number]  Height:       74.0 in Accession #:    [id number] Weight:       197.8 lb Date of Birth:  March 04, 1941  BSA:          2.16 m  Patient Age:    78 years   BP:           121/60 mmHg Patient Gender: M          HR:           63 bpm. Exam Location:  Inpatient Procedure: 2D Echo, Cardiac Doppler and Color Doppler Indications:    I51.7 Cardiomegaly  History:        Patient has no prior history of Echocardiogram examinations.                 Cardiomegaly; Risk Factors:Dyslipidemia.  Sonographer:    Sheralyn Boatman RDCS Referring Phys: [id number] [facility]  Sonographer Comments: Suboptimal subcostal window. Spinal curvature. IMPRESSIONS  1. Left ventricular ejection fraction, by visual estimation, is 55 to 60%. The left ventricle has normal function. Left ventricular septal wall thickness was mildly increased. Mildly increased left ventricular posterior wall thickness. There is mildly increased left ventricular hypertrophy.  2. Left ventricular diastolic parameters are indeterminate.  3. The left ventricle has no regional wall motion abnormalities.  4. Global right ventricle has normal systolic function.The right ventricular size is normal. No increase in right ventricular wall thickness.  5. Left atrial size was severely dilated.  6. Right atrial size was moderately dilated.  7. The mitral valve is normal in structure. Trivial mitral valve regurgitation. No evidence of mitral stenosis.  8. The tricuspid valve is normal in structure.  9. The aortic valve is normal in structure. Aortic valve regurgitation is not visualized. No evidence of aortic valve sclerosis or stenosis. 10. Pulmonic regurgitation is mild. 11. The pulmonic valve was normal in structure. Pulmonic valve regurgitation is mild. 12. Normal pulmonary artery systolic pressure. 13. The inferior vena cava is normal in size with greater than 50% respiratory variability, suggesting right atrial pressure of 3 mmHg. FINDINGS  Left Ventricle: Left ventricular ejection fraction, by visual estimation, is 55 to 60%. The left ventricle has normal function. The left ventricle has no regional wall  motion abnormalities. Mildly increased left ventricular posterior wall thickness. There is mildly increased left ventricular hypertrophy. Left ventricular diastolic parameters are indeterminate. Normal left atrial pressure. Right Ventricle: The right ventricular size is normal. No increase in right ventricular wall thickness. Global RV systolic function is has normal systolic function. The tricuspid regurgitant velocity is 1.42 m/s, and with an assumed right atrial pressure  of 3 mmHg, the estimated right ventricular systolic pressure is normal at 11.1 mmHg. Left Atrium: Left atrial size was severely dilated. Right Atrium: Right atrial size was moderately dilated Pericardium: There is no evidence of pericardial effusion. Mitral Valve: The mitral valve is normal in structure. Trivial mitral valve regurgitation. No evidence of mitral valve stenosis by observation. Tricuspid Valve: The tricuspid valve is normal in structure. Tricuspid valve regurgitation is mild. Aortic Valve: The aortic valve is normal in structure. Aortic valve regurgitation is not visualized. The aortic valve is structurally normal, with no evidence of sclerosis or stenosis. Pulmonic Valve: The pulmonic valve was normal in structure. Pulmonic valve regurgitation is mild. Pulmonic regurgitation is mild. Aorta: The aortic root, ascending aorta and aortic arch are all structurally normal, with no evidence of dilitation or obstruction. Venous: The inferior vena cava is normal in size with greater than 50% respiratory variability, suggesting right atrial pressure of 3 mmHg. IAS/Shunts: No atrial level shunt detected by color flow Doppler. There is no evidence of a patent foramen ovale. No ventricular septal defect is seen or detected. There is no evidence of an atrial septal defect.  LEFT VENTRICLE PLAX 2D LVIDd:         4.52 cm LVIDs:         3.17 cm LV PW:         1.27 cm LV IVS:        1.19 cm LVOT diam:     2.20 cm LV SV:         53 ml LV SV Index:    24.59 LVOT Area:     3.80 cm  LV Volumes (MOD) LV area d, A2C:    32.60 cm LV area d, A4C:    31.90 cm LV area s, A2C:    20.40 cm LV area s, A4C:    18.10 cm LV major d, A2C:   7.79 cm LV major d, A4C:   7.77 cm LV major s, A2C:   6.71 cm LV major s, A4C:   6.04 cm LV vol d, MOD A2C: 108.0 ml LV vol d, MOD A4C: 107.0 ml LV vol s, MOD A2C: 51.3 ml LV vol s, MOD A4C: 46.3 ml LV SV MOD A2C:     56.7 ml LV SV MOD A4C:     107.0 ml LV SV MOD BP:      56.1 ml RIGHT VENTRICLE         IVC TAPSE (M-mode): 2.6 cm  IVC diam: 1.81 cm LEFT ATRIUM             Index       RIGHT ATRIUM           Index LA diam:        4.90 cm 2.27 cm/m  RA Area:     25.00 cm LA Vol (A2C):   67.6 ml 31.25 ml/m RA Volume:   78.30 ml  36.20 ml/m LA Vol (A4C):   99.0 ml 45.77 ml/m LA Biplane Vol: 84.3 ml 38.97 ml/m  AORTIC VALVE LVOT Vmax:   133.00 cm/s LVOT Vmean:  79.400 cm/s LVOT VTI:    0.232 m  AORTA Ao Root diam: 3.60 cm Ao Asc diam:  3.60 cm MITRAL VALVE                        TRICUSPID VALVE MV Area (PHT): 3.72 cm             TR Peak grad:   8.1 mmHg MV PHT:        59.16 msec           TR Vmax:        142.00 cm/s MV Decel Time: 204 msec MV E velocity: 87.80 cm/s 103 cm/s  SHUNTS MV A velocity: 62.10 cm/s 70.3 cm/s Systemic VTI:  0.23 m MV E/A ratio:  1.41       1.5       Systemic Diam: 2.20 cm  Skeet Latch MD Electronically signed by Skeet Latch MD Signature Date/Time: 03/19/2019/2:39:57 PM    Final    VAS [place] LOWER EXTREMITY VENOUS (DVT)  Result Date: 03/19/2019  Lower Venous Study Indications: Edema.  Risk Factors: None identified. Comparison Study: No prior studies. Performing Technologist: Oliver Hum RVT  Examination Guidelines: A complete evaluation includes B-mode imaging, spectral Doppler, color Doppler, and power Doppler as needed of all accessible portions of each vessel. Bilateral testing is considered an integral part of a complete examination. Limited examinations for reoccurring indications may be  performed as noted.  +---------+---------------+---------+-----------+----------+--------------+ RIGHT    CompressibilityPhasicitySpontaneityPropertiesThrombus Aging +---------+---------------+---------+-----------+----------+--------------+ CFV      Full           Yes      Yes                                 +---------+---------------+---------+-----------+----------+--------------+ SFJ      Full                                                        +---------+---------------+---------+-----------+----------+--------------+ FV Prox  Full                                                        +---------+---------------+---------+-----------+----------+--------------+ FV Mid   Full                                                        +---------+---------------+---------+-----------+----------+--------------+ FV DistalFull                                                        +---------+---------------+---------+-----------+----------+--------------+ PFV      Full                                                        +---------+---------------+---------+-----------+----------+--------------+  POP      Full           Yes      Yes                                 +---------+---------------+---------+-----------+----------+--------------+ PTV      Full                                                        +---------+---------------+---------+-----------+----------+--------------+ PERO     Full                                                        +---------+---------------+---------+-----------+----------+--------------+   +---------+---------------+---------+-----------+----------+-----------------+ LEFT     CompressibilityPhasicitySpontaneityPropertiesThrombus Aging    +---------+---------------+---------+-----------+----------+-----------------+ CFV      Full           Yes      Yes                                     +---------+---------------+---------+-----------+----------+-----------------+ SFJ      Full                                                           +---------+---------------+---------+-----------+----------+-----------------+ FV Prox  Full                                                           +---------+---------------+---------+-----------+----------+-----------------+ FV Mid   Full                                                           +---------+---------------+---------+-----------+----------+-----------------+ FV DistalFull                                                           +---------+---------------+---------+-----------+----------+-----------------+ PFV      Full                                                           +---------+---------------+---------+-----------+----------+-----------------+ POP      Full           Yes  Yes                                    +---------+---------------+---------+-----------+----------+-----------------+ PTV      Full                                                           +---------+---------------+---------+-----------+----------+-----------------+ PERO     None                                         Age Indeterminate +---------+---------------+---------+-----------+----------+-----------------+     Summary: Right: There is no evidence of deep vein thrombosis in the lower extremity. No cystic structure found in the popliteal fossa. 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 No cystic structure found in the popliteal fossa.  *See table(s) above for measurements and observations. Electronically signed by Lemar Livings MD on 03/19/2019 at 3:31:17 PM.    Final     Medications:   . sodium chloride   Intravenous Once  . famotidine  20 mg Oral BID  . simvastatin  20 mg Oral QHS  . sodium chloride flush  3 mL Intravenous Q12H  .  tamsulosin  0.4 mg Oral QHS  . vitamin B-12  1,000 mcg Oral Daily   Continuous Infusions: . sodium chloride 20 mL/hr at 03/19/19 1802     LOS: 1 day   Joseph Art  [facility]   How to contact the [facility] Attending or Consulting provider 7A - 7P or covering provider during after hours 7P -7A, for this patient?  1. Check the care team in [facility] and look for a) attending/consulting [facility] provider listed and b) the [facility] team listed 2. Log into [email or web address] and use [place]'s universal password to access. If you do not have the password, please contact the hospital operator. 3. Locate the [facility] provider you are looking for under [facility] and page to a number that you can be directly reached. 4. If you still have difficulty reaching the provider, please page the [facility] (Director on Call) for the Hospitalists listed on amion for assistance.  03/20/2019, 10:31 AM

## 2019-03-20 NOTE — Plan of Care (Signed)

## 2019-03-20 NOTE — Progress Notes (Signed)
Not adequately prepped.  More prep today, retry colonoscopy (+/- EGD) tomorrow. 

## 2019-03-20 NOTE — Progress Notes (Signed)
CCMD called with SB and irregular HR, Dr.Vann notified and EKG completed, copy placed in chart. Will continue to monitor

## 2019-03-20 NOTE — Progress Notes (Signed)
Patient is on Overnight Pulse Oximetry at this time. No distress or complications noted

## 2019-03-20 NOTE — H&P (View-Only) (Signed)
Not adequately prepped.  More prep today, retry colonoscopy (+/- EGD) tomorrow.

## 2019-03-21 ENCOUNTER — Inpatient Hospital Stay (HOSPITAL_COMMUNITY): Payer: Federal, State, Local not specified - PPO | Admitting: Anesthesiology

## 2019-03-21 ENCOUNTER — Encounter (HOSPITAL_COMMUNITY): Payer: Self-pay | Admitting: Internal Medicine

## 2019-03-21 ENCOUNTER — Other Ambulatory Visit: Payer: Self-pay | Admitting: Cardiology

## 2019-03-21 ENCOUNTER — Encounter (HOSPITAL_COMMUNITY)
Admission: EM | Disposition: A | Discharge status: Home / Self Care | Payer: Self-pay | Source: Home / Self Care | Attending: Internal Medicine

## 2019-03-21 ENCOUNTER — Inpatient Hospital Stay (HOSPITAL_COMMUNITY): Payer: Federal, State, Local not specified - PPO

## 2019-03-21 DIAGNOSIS — I517 Cardiomegaly: Secondary | ICD-10-CM

## 2019-03-21 DIAGNOSIS — K449 Diaphragmatic hernia without obstruction or gangrene: Secondary | ICD-10-CM

## 2019-03-21 DIAGNOSIS — I480 Paroxysmal atrial fibrillation: Secondary | ICD-10-CM

## 2019-03-21 DIAGNOSIS — D500 Iron deficiency anemia secondary to blood loss (chronic): Principal | ICD-10-CM

## 2019-03-21 DIAGNOSIS — I471 Supraventricular tachycardia: Secondary | ICD-10-CM

## 2019-03-21 HISTORY — PX: BIOPSY: SHX5522

## 2019-03-21 HISTORY — PX: ESOPHAGOGASTRODUODENOSCOPY (EGD) WITH PROPOFOL: SHX5813

## 2019-03-21 HISTORY — PX: COLONOSCOPY WITH PROPOFOL: SHX5780

## 2019-03-21 HISTORY — PX: POLYPECTOMY: SHX5525

## 2019-03-21 LAB — CBC
HCT: 33.2 % — ABNORMAL LOW (ref 39.0–52.0)
Hemoglobin: 9.7 g/dL — ABNORMAL LOW (ref 13.0–17.0)
MCH: 22.5 pg — ABNORMAL LOW (ref 26.0–34.0)
MCHC: 29.2 g/dL — ABNORMAL LOW (ref 30.0–36.0)
MCV: 76.9 fL — ABNORMAL LOW (ref 80.0–100.0)
Platelets: 267 10*3/uL (ref 150–400)
RBC: 4.32 MIL/uL (ref 4.22–5.81)
RDW: 23.9 % — ABNORMAL HIGH (ref 11.5–15.5)
WBC: 4.8 10*3/uL (ref 4.0–10.5)
nRBC: 0 % (ref 0.0–0.2)

## 2019-03-21 LAB — BASIC METABOLIC PANEL
Anion gap: 7 (ref 5–15)
BUN: <5 mg/dL — ABNORMAL LOW (ref 8–23)
CO2: 26 mmol/L (ref 22–32)
Calcium: 9.1 mg/dL (ref 8.9–10.3)
Chloride: 107 mmol/L (ref 98–111)
Creatinine, Ser: 0.89 mg/dL (ref 0.61–1.24)
GFR calc Af Amer: >60 mL/min (ref >60)
GFR calc non Af Amer: >60 mL/min (ref >60)
Glucose, Bld: 99 mg/dL (ref 70–99)
Potassium: 3.7 mmol/L (ref 3.5–5.1)
Sodium: 140 mmol/L (ref 135–145)

## 2019-03-21 SURGERY — COLONOSCOPY WITH PROPOFOL
Anesthesia: Monitor Anesthesia Care

## 2019-03-21 MED ORDER — LACTATED RINGERS IV SOLN
INTRAVENOUS | Status: DC
  Administered 2019-03-21: 1000 mL via INTRAVENOUS

## 2019-03-21 MED ORDER — FERROUS SULFATE 325 (65 FE) MG PO TABS
325.0000 mg | ORAL_TABLET | Freq: Two times a day (BID) | ORAL | Status: DC
  Administered 2019-03-21 – 2019-03-22 (×2): 325 mg via ORAL
  Filled 2019-03-21 (×2): qty 1

## 2019-03-21 MED ORDER — PROPOFOL 10 MG/ML IV BOLUS
INTRAVENOUS | Status: DC | PRN
  Administered 2019-03-21: 30 mg via INTRAVENOUS
  Administered 2019-03-21: 20 mg via INTRAVENOUS

## 2019-03-21 MED ORDER — ONDANSETRON HCL 4 MG/2ML IJ SOLN
INTRAMUSCULAR | Status: DC | PRN
  Administered 2019-03-21: 4 mg via INTRAVENOUS

## 2019-03-21 MED ORDER — HEPARIN BOLUS VIA INFUSION
4450.0000 [IU] | Freq: Once | INTRAVENOUS | Status: AC
  Administered 2019-03-21: 4450 [IU] via INTRAVENOUS
  Filled 2019-03-21: qty 4450

## 2019-03-21 MED ORDER — HEPARIN (PORCINE) 25000 UT/250ML-% IV SOLN
1450.0000 [IU]/h | INTRAVENOUS | Status: DC
  Administered 2019-03-21 – 2019-03-23 (×3): 1450 [IU]/h via INTRAVENOUS
  Filled 2019-03-21 (×3): qty 250

## 2019-03-21 MED ORDER — PANTOPRAZOLE SODIUM 40 MG PO TBEC
40.0000 mg | DELAYED_RELEASE_TABLET | Freq: Every day | ORAL | Status: DC
  Administered 2019-03-21 – 2019-03-24 (×4): 40 mg via ORAL
  Filled 2019-03-21 (×4): qty 1

## 2019-03-21 MED ORDER — IOHEXOL 350 MG/ML SOLN
100.0000 mL | Freq: Once | INTRAVENOUS | Status: AC | PRN
  Administered 2019-03-21: 78 mL via INTRAVENOUS

## 2019-03-21 MED ORDER — PROPOFOL 500 MG/50ML IV EMUL
INTRAVENOUS | Status: DC | PRN
  Administered 2019-03-21: 125 ug/kg/min via INTRAVENOUS

## 2019-03-21 SURGICAL SUPPLY — 25 items

## 2019-03-21 NOTE — Plan of Care (Signed)
  Problem: Education: Goal: Knowledge of General Education information will improve Description: Including pain rating scale, medication(s)/side effects and non-pharmacologic comfort measures Outcome: Progressing   Problem: Clinical Measurements: Goal: Ability to maintain clinical measurements within normal limits will improve Outcome: Progressing Goal: Diagnostic test results will improve Outcome: Progressing Goal: Cardiovascular complication will be avoided Outcome: Progressing   Problem: Activity: Goal: Risk for activity intolerance will decrease Outcome: Progressing   Problem: Coping: Goal: Level of anxiety will decrease Outcome: Progressing   Problem: Safety: Goal: Ability to remain free from injury will improve Outcome: Progressing   Problem: Pain Managment: Goal: General experience of comfort will improve Outcome: Progressing

## 2019-03-21 NOTE — Op Note (Signed)
[facility] Patient Name: Joshua Estrada Procedure Date : 03/21/2019 MRN: [id number] Attending MD: Kerin Salen , MD Date of Birth: March 14, 1940 CSN: [id number] Age: 79 Admit Type: Inpatient Procedure:                Colonoscopy Indications:              This is the patient's first colonoscopy,                            Unexplained iron deficiency anemia Providers:                Kerin Salen, MD, Janae Sauce. Steele Berg, RN, Lawson Radar,                            Technician, Orvilla Fus, CRNA Referring MD:             [facility] Medicines:                Monitored Anesthesia Care Complications:            No immediate complications. Estimated blood loss:                            Minimal. Estimated Blood Loss:     Estimated blood loss was minimal. Procedure:                Pre-Anesthesia Assessment:                           - Prior to the procedure, a History and Physical                            was performed, and patient medications and                            allergies were reviewed. The patient's tolerance of                            previous anesthesia was also reviewed. The risks                            and benefits of the procedure and the sedation                            options and risks were discussed with the patient.                            All questions were answered, and informed consent                            was obtained. Prior Anticoagulants: The patient has                            taken no previous anticoagulant or antiplatelet  agents except for aspirin. ASA Grade Assessment:                            III - A patient with severe systemic disease. After                            reviewing the risks and benefits, the patient was                            deemed in satisfactory condition to undergo the                            procedure.                           After obtaining informed consent, the  colonoscope                            was passed under direct vision. Throughout the                            procedure, the patient's blood pressure, pulse, and                            oxygen saturations were monitored continuously. The                            PCF-H190DL ([id number]) Olympus pediatric colonoscope                            was introduced through the anus and advanced to the                            the terminal ileum. The colonoscopy was performed                            without difficulty. The patient tolerated the                            procedure well. The quality of the bowel                            preparation was good. Scope In: 8:11:03 AM Scope Out: 8:29:37 AM Scope Withdrawal Time: 0 hours 14 minutes 6 seconds  Total Procedure Duration: 0 hours 18 minutes 34 seconds  Findings:      The perianal and digital rectal examinations were normal.      The terminal ileum appeared normal.      A 5 mm polyp was found in the transverse colon. The polyp was sessile.       The polyp was removed with a piecemeal technique using a cold biopsy       forceps. Resection and retrieval were complete.      Multiple small and large-mouthed diverticula were found in the sigmoid       colon and descending colon.  The exam was otherwise without abnormality on direct and retroflexion       views. Impression:               - The examined portion of the ileum was normal.                           - One 5 mm polyp in the transverse colon, removed                            piecemeal using a cold biopsy forceps. Resected and                            retrieved.                           - Diverticulosis in the sigmoid colon and in the                            descending colon.                           - The examination was otherwise normal on direct                            and retroflexion views. Moderate Sedation:      Patient did not receive moderate sedation  for this procedure, but       instead received monitored anesthesia care. Recommendation:           - High fiber diet.                           - Continue present medications.                           - Await pathology results.                           - Repeat colonoscopy is not recommended due to                            current age. Procedure Code(s):        --- Professional ---                           [phone], Colonoscopy, flexible; with biopsy, single                            or multiple Diagnosis Code(s):        --- Professional ---                           K63.5, Polyp of colon                           D50.9, Iron deficiency anemia, unspecified  K57.30, Diverticulosis of large intestine without                            perforation or abscess without bleeding CPT copyright 2019 American Medical Association. All rights reserved. The codes documented in this report are preliminary and upon coder review may  be revised to meet current compliance requirements. Kerin Salen, MD 03/21/2019 8:44:12 AM This report has been signed electronically. Number of Addenda: 0

## 2019-03-21 NOTE — Brief Op Note (Signed)
karkia011/13/2021 - 03/21/2019  8:41 AM  PATIENT:  Joshua Estrada  79 y.o. male  PRE-OPERATIVE DIAGNOSIS:  iron deficiency anemia  POST-OPERATIVE DIAGNOSIS:  diverticulosis, transverse polyp , 10cm hiatal hernia , mild gastritis  PROCEDURE:  Procedure(s): COLONOSCOPY WITH PROPOFOL (N/A) ESOPHAGOGASTRODUODENOSCOPY (EGD) WITH PROPOFOL (N/A)  SURGEON:  Surgeon(s) and Role:    Ronnette Juniper, MD - Primary  PHYSICIAN ASSISTANT:   ASSISTANTS:Karen Hinson, RN, Laverda Sorenson, Tech   ANESTHESIA:   MAC  EBL:  Minimal  BLOOD ADMINISTERED:none  DRAINS: none   LOCAL MEDICATIONS USED:  NONE  SPECIMEN:  Biopsies  DISPOSITION OF SPECIMEN:  PATHOLOGY  COUNTS:  YES  TOURNIQUET:  * No tourniquets in log *  DICTATION: .Dragon Dictation  PLAN OF CARE: Admit to inpatient   PATIENT DISPOSITION:  PACU - hemodynamically stable.   Delay start of Pharmacological VTE agent (>24hrs) due to surgical blood loss or risk of bleeding: not applicable

## 2019-03-21 NOTE — Interval H&P Note (Signed)
History and Physical Interval Note: 78/male with severe iron deficiency anemia, for a colonoscopy and possible EGD today.  03/21/2019 7:38 AM  Joshua Estrada  has presented today for Colonoscopy and EGD with the diagnosis of iron deficiency anemia.  The various methods of treatment have been discussed with the patient and family. After consideration of risks, benefits and other options for treatment, the patient has consented to  Procedure(s): COLONOSCOPY WITH PROPOFOL (N/A) ESOPHAGOGASTRODUODENOSCOPY (EGD) WITH PROPOFOL (N/A) as a surgical intervention.  The patient's history has been reviewed, patient examined, no change in status, stable for surgery.  I have reviewed the patient's chart and labs.  Questions were answered to the patient's satisfaction.     Kerin Salen

## 2019-03-21 NOTE — Anesthesia Procedure Notes (Signed)
Procedure Name: MAC Date/Time: 03/21/2019 8:00 AM Performed by: Leonor Liv, CRNA Pre-anesthesia Checklist: Patient identified, Emergency Drugs available, Suction available, Patient being monitored and Timeout performed Patient Re-evaluated:Patient Re-evaluated prior to induction Oxygen Delivery Method: Nasal cannula Placement Confirmation: positive ETCO2 Dental Injury: Teeth and Oropharynx as per pre-operative assessment

## 2019-03-21 NOTE — Consult Note (Signed)
CONSULTATION NOTE   Patient Name: Joshua Estrada Date of Encounter: 03/21/2019 Cardiologist: Donato Heinz, MD  Chief Complaint   Consulted for arrythmia  Patient Profile   79 yo male with HLD, BPH, GERD, recently seen by Dr. Gardiner Rhyme on 03/17/19 for shortness of breath, chest pain and cardiomegaly, found to have Hgb of 4.2 - sent to the ER. Echo done here shows LVEF 55-60%, severe LAE and moderate RAE, low RA pressure. Myoview ordered, but not performed. Overnight had tachyarrhythmia - cards consulted.  HPI   Joshua Estrada is a 79 y.o. male who is being seen today for the evaluation of afib/tachycardia at the request of Dr. Eliseo Squires. This is a 79 yo male recently seen by Dr. Gardiner Rhyme for shortness of breath, chest pain and cardiomegaly - found to have anemia with Hgb 4.2 - sent to the hospital. Echo shows normal LVEF, however, there is biatrial enlargement. He was not ed to go into a rapid narrow complex rhythm overnight, which appears to be an SVT - this deteriorated into atrial fibrillation with RVR. Ultimately, he spontaneously converted back to sinus. Over the past few days, he has undergone GI workup for anemia. EGD/COLON showed large hiatal hernia and possible chronic blood loss related to that. Consult question about arrhyhtmia - he has been noted to have tachycardia and bradycardia. 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  He denies symptomatic palpitations or further chest pain.  Hemoglobin is now up to 9.7.  PMHx   Past Medical History:  Diagnosis Date  . BPH (benign prostatic hyperplasia)   . Hyperlipidemia     Past Surgical History:  Procedure Laterality Date  . Cataract surgery    . TONSILLECTOMY     As a child    FAMHx   Family History  Problem Relation Age of Onset  . Breast cancer Mother   . Congestive Heart Failure Father     SOCHx    reports that he has never smoked. He has never used smokeless tobacco. He reports that he  does not drink alcohol or use drugs.  Outpatient Medications   No current facility-administered medications on file prior to encounter.   Current Outpatient Medications on File Prior to Encounter  Medication Sig Dispense Refill  . aspirin EC 81 MG tablet Take 81 mg by mouth daily.    . famotidine (PEPCID) 40 MG tablet Take 1 tablet by mouth 2 (two) times daily.     . Multiple Vitamins-Minerals (PRESERVISION AREDS 2 PO) Take 1 tablet by mouth 2 (two) times daily.     . simvastatin (ZOCOR) 20 MG tablet Take 20 mg by mouth at bedtime.    . tamsulosin (FLOMAX) 0.4 MG CAPS capsule Take 0.4 mg by mouth at bedtime.       Inpatient Medications    Scheduled Meds: . sodium chloride   Intravenous Once  . famotidine  20 mg Oral BID  . ferrous sulfate  325 mg Oral BID WC  . pantoprazole  40 mg Oral Daily  . simvastatin  20 mg Oral QHS  . sodium chloride flush  3 mL Intravenous Q12H  . tamsulosin  0.4 mg Oral QHS  . vitamin B-12  1,000 mcg Oral Daily    Continuous Infusions: . lactated ringers 1,000 mL (03/21/19 0728)    PRN Meds: acetaminophen **OR** acetaminophen, albuterol, ondansetron **OR** ondansetron (ZOFRAN) IV   ALLERGIES   No Known Allergies  ROS   Pertinent items noted in HPI and  remainder of comprehensive ROS otherwise negative.  Vitals   Vitals:   03/21/19 0910 03/21/19 0915 03/21/19 0920 03/21/19 0934  BP:   (!) 122/57 (!) 124/98  Pulse: 88 (!) 47 100 (!) 50  Resp: 20 17 18 18   Temp:    98.2 F (36.8 C)  TempSrc:    Oral  SpO2: 95% 91% 95% 100%  Weight:      Height:        Intake/Output Summary (Last 24 hours) at 03/21/2019 1128 Last data filed at 03/21/2019 0850 Gross per 24 hour  Intake 603 ml  Output --  Net 603 ml   Filed Weights   03/19/19 0107 03/21/19 0725  Weight: 89.7 kg 89.7 kg    Physical Exam   General appearance: alert and no distress Neck: no carotid bruit, no JVD and thyroid not enlarged, symmetric, no  tenderness/mass/nodules Lungs: clear to auscultation bilaterally Heart: regular rate and rhythm Abdomen: soft, non-tender; bowel sounds normal; no masses,  no organomegaly Extremities: extremities normal, atraumatic, no cyanosis or edema Pulses: 2+ and symmetric Skin: Skin color, texture, turgor normal. No rashes or lesions Neurologic: Grossly normal Psych: Pleasant  Labs   Results for orders placed or performed during the hospital encounter of 03/18/19 (from the past 48 hour(s))  CBC     Status: Abnormal   Collection Time: 03/20/19  2:50 AM  Result Value Ref Range   WBC 4.9 4.0 - 10.5 K/uL   RBC 4.09 (L) 4.22 - 5.81 MIL/uL   Hemoglobin 9.3 (L) 13.0 - 17.0 g/dL   HCT 03/22/19 (L) 32.9 - 51.8 %   MCV 77.5 (L) 80.0 - 100.0 fL   MCH 22.7 (L) 26.0 - 34.0 pg   MCHC 29.3 (L) 30.0 - 36.0 g/dL   RDW 84.1 (H) 66.0 - 63.0 %   Platelets 319 150 - 400 K/uL   nRBC 0.0 0.0 - 0.2 %    Comment: Performed at [facility], [address], [place], [place]  Basic metabolic panel     Status: Abnormal   Collection Time: 03/20/19  2:50 AM  Result Value Ref Range   Sodium 142 135 - 145 mmol/L   Potassium 3.8 3.5 - 5.1 mmol/L   Chloride 110 98 - 111 mmol/L   CO2 24 22 - 32 mmol/L   Glucose, Bld 97 70 - 99 mg/dL   BUN 7 (L) 8 - 23 mg/dL   Creatinine, Ser 03/22/19 0.61 - 1.24 mg/dL   Calcium 8.8 (L) 8.9 - 10.3 mg/dL   GFR calc non Af Amer >60 >60 mL/min   GFR calc Af Amer >60 >60 mL/min   Anion gap 8 5 - 15    Comment: Performed at [facility], [address] [place], [place]  CBC     Status: Abnormal   Collection Time: 03/21/19  2:59 AM  Result Value Ref Range   WBC 4.8 4.0 - 10.5 K/uL   RBC 4.32 4.22 - 5.81 MIL/uL   Hemoglobin 9.7 (L) 13.0 - 17.0 g/dL   HCT 03/23/19 (L) 25.4 - 27.0 %   MCV 76.9 (L) 80.0 - 100.0 fL   MCH 22.5 (L) 26.0 - 34.0 pg   MCHC 29.2 (L) 30.0 - 36.0 g/dL   RDW 62.3 (H) 76.2 - 83.1 %   Platelets 267 150 - 400 K/uL   nRBC 0.0 0.0 - 0.2 %     Comment: Performed at [facility], [address], [place], [place] [address]  Basic metabolic panel     Status: Abnormal   Collection Time: 03/21/19  2:59 AM  Result Value Ref Range   Sodium 140 135 - 145 mmol/L   Potassium 3.7 3.5 - 5.1 mmol/L   Chloride 107 98 - 111 mmol/L   CO2 26 22 - 32 mmol/L   Glucose, Bld 99 70 - 99 mg/dL   BUN <5 (L) 8 - 23 mg/dL   Creatinine, Ser 3.47 0.61 - 1.24 mg/dL   Calcium 9.1 8.9 - 42.5 mg/dL   GFR calc non Af Amer >60 >60 mL/min   GFR calc Af Amer >60 >60 mL/min   Anion gap 7 5 - 15    Comment: Performed at [facility], [address] [place], [place] [address]    ECG   Sinus bradycardia at 55 - Personally Reviewed  Telemetry   Bradycardia alternating with SVT/afib, now sinus - Personally Reviewed  Radiology   VAS [place] LOWER EXTREMITY VENOUS (DVT)  Result Date: 03/19/2019  Lower Venous Study Indications: Edema.  Risk Factors: None identified. Comparison Study: No prior studies. Performing Technologist: Chanda Busing RVT  Examination Guidelines: A complete evaluation includes B-mode imaging, spectral Doppler, color Doppler, and power Doppler as needed of all accessible portions of each vessel. Bilateral testing is considered an integral part of a complete examination. Limited examinations for reoccurring indications may be performed as noted.  +---------+---------------+---------+-----------+----------+--------------+ RIGHT    CompressibilityPhasicitySpontaneityPropertiesThrombus Aging +---------+---------------+---------+-----------+----------+--------------+ CFV      Full           Yes      Yes                                 +---------+---------------+---------+-----------+----------+--------------+ SFJ      Full                                                        +---------+---------------+---------+-----------+----------+--------------+ FV Prox  Full                                                         +---------+---------------+---------+-----------+----------+--------------+ FV Mid   Full                                                        +---------+---------------+---------+-----------+----------+--------------+ FV DistalFull                                                        +---------+---------------+---------+-----------+----------+--------------+ PFV      Full                                                        +---------+---------------+---------+-----------+----------+--------------+  POP      Full           Yes      Yes                                 +---------+---------------+---------+-----------+----------+--------------+ PTV      Full                                                        +---------+---------------+---------+-----------+----------+--------------+ PERO     Full                                                        +---------+---------------+---------+-----------+----------+--------------+   +---------+---------------+---------+-----------+----------+-----------------+ LEFT     CompressibilityPhasicitySpontaneityPropertiesThrombus Aging    +---------+---------------+---------+-----------+----------+-----------------+ CFV      Full           Yes      Yes                                    +---------+---------------+---------+-----------+----------+-----------------+ SFJ      Full                                                           +---------+---------------+---------+-----------+----------+-----------------+ FV Prox  Full                                                           +---------+---------------+---------+-----------+----------+-----------------+ FV Mid   Full                                                           +---------+---------------+---------+-----------+----------+-----------------+ FV DistalFull                                                            +---------+---------------+---------+-----------+----------+-----------------+ PFV      Full                                                           +---------+---------------+---------+-----------+----------+-----------------+ POP      Full           Yes  Yes                                    +---------+---------------+---------+-----------+----------+-----------------+ PTV      Full                                                           +---------+---------------+---------+-----------+----------+-----------------+ PERO     None                                         Age Indeterminate +---------+---------------+---------+-----------+----------+-----------------+     Summary: Right: There is no evidence of deep vein thrombosis in the lower extremity. No cystic structure found in the popliteal fossa. 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 No cystic structure found in the popliteal fossa.  *See table(s) above for measurements and observations. Electronically signed by Lemar LivingsBrandon Cain MD on 03/19/2019 at 3:31:17 PM.    Final     Cardiac Studies   Procedure: 2D Echo, Cardiac Doppler and Color Doppler  Indications:    I51.7 Cardiomegaly   History:        Patient has no prior history of Echocardiogram examinations.                 Cardiomegaly; Risk Factors:Dyslipidemia.   Sonographer:    Sheralyn Boatmanina West RDCS Referring Phys: [id number] [facility] SM[facility]    Sonographer Comments: Suboptimal subcostal window. Spinal curvature. IMPRESSIONS    1. Left ventricular ejection fraction, by visual estimation, is 55 to 60%. The left ventricle has normal function. Left ventricular septal wall thickness was mildly increased. Mildly increased left ventricular posterior wall thickness. There is mildly  increased left ventricular hypertrophy.  2. Left ventricular diastolic parameters are indeterminate.  3. The left  ventricle has no regional wall motion abnormalities.  4. Global right ventricle has normal systolic function.The right ventricular size is normal. No increase in right ventricular wall thickness.  5. Left atrial size was severely dilated.  6. Right atrial size was moderately dilated.  7. The mitral valve is normal in structure. Trivial mitral valve regurgitation. No evidence of mitral stenosis.  8. The tricuspid valve is normal in structure.  9. The aortic valve is normal in structure. Aortic valve regurgitation is not visualized. No evidence of aortic valve sclerosis or stenosis. 10. Pulmonic regurgitation is mild. 11. The pulmonic valve was normal in structure. Pulmonic valve regurgitation is mild. 12. Normal pulmonary artery systolic pressure. 13. The inferior vena cava is normal in size with greater than 50% respiratory variability, suggesting right atrial pressure of 3 mmHg.  Impression   1. Principal Problem: 2.   Iron deficiency anemia due to chronic blood loss 3. Active Problems: 4.   Leukopenia 5.   Cardiomegaly 6.   GERD (gastroesophageal reflux disease) 7.   BPH (benign prostatic hyperplasia) 8.   Hyperlipidemia 9.   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 Mr. Cherene AltesHay is incidentally noted to have SVT/Afib with alternating bradycardia. He was asymptomatic with this. He is currently in sinus. Hemoglobin has improved from 4.2 ->9.7. Large hiatal hernia is blamed by GI for  this. CHADSVASC score is at least 2 for age >80, however, given the brevity of his afib and recent profound anemia, would not recommend anticoagulation for this now. Would recommend a 30-day outpatient monitor and follow-up with Dr. Bjorn Pippin afterwards. 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 CT scan follow-up is ordered - may not need to treat this if it is old/organized. Given his bradycardia, would not recommend AVN blocker at this point.  Thanks for the consultation. We  will arrange for the monitor and follow-up.  [facility] will sign off.   Medication Recommendations:  As above Other recommendations (labs, testing, etc):  none Follow up as an outpatient:  Dr. Bjorn Pippin   Time Spent Directly with Patient:  I have spent a total of 45 minutes with the patient reviewing hospital notes, telemetry, EKGs, labs and examining the patient as well as establishing an assessment and plan that was discussed personally with the patient.  > 50% of time was spent in direct patient care.  Length of Stay:  LOS: 2 days   Chrystie Nose, MD, [facility], FACP  [place]  [facility]  Medical Director of the [facility] Lip[facility]  Card[facility]ovascular Risk Reduction Clinic Diplomate of the American Board of Clinical Lipidology Attending Cardiologist  Direct Dial: [phone]  Fax: [phone]  Website:  [email or web address] Yarethzy Croak 03/21/2019, 11:28 AM

## 2019-03-21 NOTE — Op Note (Signed)
[facility] Patient Name: Joshua Estrada Procedure Date : 03/21/2019 MRN: [id number] Attending MD: Kerin Salen , MD Date of Birth: 02-03-1941 CSN: [id number] Age: 79 Admit Type: Inpatient Procedure:                Upper GI endoscopy Indications:              Unexplained iron deficiency anemia Providers:                Kerin Salen, MD, Janae Sauce. Steele Berg, RN, Lawson Radar,                            Technician, Orvilla Fus, CRNA Referring MD:              Medicines:                Monitored Anesthesia Care Complications:            No immediate complications. Estimated blood loss:                            None. Estimated Blood Loss:     Estimated blood loss: none. Procedure:                Pre-Anesthesia Assessment:                           - Prior to the procedure, a History and Physical                            was performed, and patient medications and                            allergies were reviewed. The patient's tolerance of                            previous anesthesia was also reviewed. The risks                            and benefits of the procedure and the sedation                            options and risks were discussed with the patient.                            All questions were answered, and informed consent                            was obtained. Prior Anticoagulants: The patient has                            taken no previous anticoagulant or antiplatelet                            agents except for aspirin. ASA Grade Assessment:  III - A patient with severe systemic disease. After                            reviewing the risks and benefits, the patient was                            deemed in satisfactory condition to undergo the                            procedure.                           - Prior to the procedure, a History and Physical                            was performed, and patient medications and                             allergies were reviewed. The patient's tolerance of                            previous anesthesia was also reviewed. The risks                            and benefits of the procedure and the sedation                            options and risks were discussed with the patient.                            All questions were answered, and informed consent                            was obtained. Prior Anticoagulants: The patient has                            taken no previous anticoagulant or antiplatelet                            agents except for aspirin. ASA Grade Assessment:                            III - A patient with severe systemic disease. After                            reviewing the risks and benefits, the patient was                            deemed in satisfactory condition to undergo the                            procedure.  After obtaining informed consent, the endoscope was                            passed under direct vision. Throughout the                            procedure, the patient's blood pressure, pulse, and                            oxygen saturations were monitored continuously. The                            [id number]-[id number] ([id number]) Olympus gastroscope was                            introduced through the mouth, and advanced to the                            second part of duodenum. The upper GI endoscopy was                            accomplished without difficulty. The patient                            tolerated the procedure well. Scope In: Scope Out: Findings:      The examined esophagus was significantly tortuous.      The Z-line was regular and was found 40 cm from the incisors.      A 10 cm hiatal hernia was present.      Localized mildly erythematous mucosa without bleeding was found in the       gastric antrum. Biopsies were taken with a cold forceps for Helicobacter       pylori testing.      The cardia  and gastric fundus were normal on retroflexion except for the       large hiatal hernia.      The examined duodenum was normal. Biopsies for histology were taken with       a cold forceps for evaluation of celiac disease. Impression:               - Tortuous esophagus.                           - Z-line regular, 40 cm from the incisors.                           - 10 cm hiatal hernia.                           - Erythematous mucosa in the antrum. Biopsied.                           - Normal examined duodenum. Biopsied. Moderate Sedation:      Patient did not receive moderate sedation for this procedure, but       instead received monitored anesthesia care. Recommendation:           -  Resume regular diet.                           - Continue present medications.                           - Await pathology results.                           - Patient may have chronic blood loss related to                            the large hiatal hernia and will benefit from iron                            supplementation and PPI use. Procedure Code(s):        --- Professional ---                           [phone], Esophagogastroduodenoscopy, flexible,                            transoral; with biopsy, single or multiple Diagnosis Code(s):        --- Professional ---                           Q39.9, Congenital malformation of esophagus,                            unspecified                           K44.9, Diaphragmatic hernia without obstruction or                            gangrene                           K31.89, Other diseases of stomach and duodenum                           D50.9, Iron deficiency anemia, unspecified CPT copyright 2019 American Medical Association. All rights reserved. The codes documented in this report are preliminary and upon coder review may  be revised to meet current compliance requirements. Kerin Salen, MD 03/21/2019 8:47:08 AM This report has been signed  electronically. Number of Addenda: 0

## 2019-03-21 NOTE — Op Note (Signed)
Patient presented with iron deficiency anemia.  Colonoscopy showed: Diverticulosis of sigmoid and descending colon. 5 mm transverse colon polyp, removed with biopsy forceps. Unremarkable terminal ileum. Normal colon otherwise.   EGD showed: A large 10 cm hiatal hernia. Tortuous esophagus. Mild antral gastritis, biopsies taken for H. Pylori. Normal-appearing duodenum, biopsies taken for celiac.   Recommendations: Resume regular diet. Patient possibly has chronic blood loss related to large hiatal hernia and will benefit from iron supplementation and PPI indefinitely. Pathology may be followed as outpatient. Okay to DC home from GI standpoint.   Kerin Salen, MD

## 2019-03-21 NOTE — Transfer of Care (Signed)
Immediate Anesthesia Transfer of Care Note  Patient: Joshua Estrada  Procedure(s) Performed: COLONOSCOPY WITH PROPOFOL (N/A ) ESOPHAGOGASTRODUODENOSCOPY (EGD) WITH PROPOFOL (N/A ) POLYPECTOMY BIOPSY  Patient Location: Endoscopy Unit  Anesthesia Type:MAC  Level of Consciousness: drowsy  Airway & Oxygen Therapy: Patient Spontanous Breathing and Patient connected to nasal cannula oxygen  Post-op Assessment: Report given to RN, Post -op Vital signs reviewed and stable and Patient moving all extremities  Post vital signs: Reviewed and stable  Last Vitals:  Vitals Value Taken Time  BP 85/36 03/21/19 0848  Temp    Pulse 57 03/21/19 0850  Resp 18 03/21/19 0850  SpO2 99 % 03/21/19 0850  Vitals shown include unvalidated device data.  Last Pain:  Vitals:   03/21/19 0725  TempSrc: Oral  PainSc: 0-No pain         Complications: No apparent anesthesia complications

## 2019-03-21 NOTE — Anesthesia Preprocedure Evaluation (Signed)
Anesthesia Evaluation  Patient identified by MRN, date of birth, ID band Patient awake    Reviewed: Allergy & Precautions, NPO status , Patient's Chart, lab work & pertinent test results  Airway Mallampati: II  TM Distance: >3 FB     Dental   Pulmonary neg pulmonary ROS,    breath sounds clear to auscultation       Cardiovascular negative cardio ROS   Rhythm:Regular Rate:Normal     Neuro/Psych negative neurological ROS     GI/Hepatic Neg liver ROS, GERD  ,GI bleed    Endo/Other  negative endocrine ROS  Renal/GU negative Renal ROS     Musculoskeletal   Abdominal   Peds  Hematology  (+) anemia ,   Anesthesia Other Findings   Reproductive/Obstetrics                             Anesthesia Physical Anesthesia Plan  ASA: III  Anesthesia Plan: MAC   Post-op Pain Management:    Induction: Intravenous  PONV Risk Score and Plan: 1 and Propofol infusion, Ondansetron and Treatment may vary due to age or medical condition  Airway Management Planned: Natural Airway and Nasal Cannula  Additional Equipment:   Intra-op Plan:   Post-operative Plan:   Informed Consent: I have reviewed the patients History and Physical, chart, labs and discussed the procedure including the risks, benefits and alternatives for the proposed anesthesia with the patient or authorized representative who has indicated his/her understanding and acceptance.       Plan Discussed with:   Anesthesia Plan Comments:         Anesthesia Quick Evaluation

## 2019-03-21 NOTE — Progress Notes (Signed)
ANTICOAGULATION CONSULT NOTE - Initial Consult  Pharmacy Consult for heparin Indication: pulmonary embolus  No Known Allergies  Patient Measurements: Height: 6\' 2"  (188 cm) Weight: 197 lb 12 oz (89.7 kg) IBW/kg (Calculated) : 82.2 Heparin Dosing Weight: 89.7 kg   Vital Signs: Temp: 98.2 F (36.8 C) (01/16 0934) Temp Source: Oral (01/16 0934) BP: 124/98 (01/16 0934) Pulse Rate: 50 (01/16 0934)  Labs: Recent Labs    03/18/19 1724 03/18/19 1728 03/19/19 1040 03/19/19 1040 03/20/19 0250 03/21/19 0259  HGB  --    < > 8.6*   < > 9.3* 9.7*  HCT  --    < > 28.6*  --  31.7* 33.2*  PLT  --   --  303  --  319 267  LABPROT 14.4  --   --   --   --   --   INR 1.1  --   --   --   --   --   CREATININE  --   --  0.99  --  1.06 0.89   < > = values in this interval not displayed.    Estimated Creatinine Clearance: 82.1 mL/min (by C-G formula based on SCr of 0.89 mg/dL).   Medical History: Past Medical History:  Diagnosis Date  . BPH (benign prostatic hyperplasia)   . Hyperlipidemia     Medications:  Scheduled:  . sodium chloride   Intravenous Once  . famotidine  20 mg Oral BID  . ferrous sulfate  325 mg Oral BID WC  . pantoprazole  40 mg Oral Daily  . simvastatin  20 mg Oral QHS  . sodium chloride flush  3 mL Intravenous Q12H  . tamsulosin  0.4 mg Oral QHS  . vitamin B-12  1,000 mcg Oral Daily    Assessment: 44 yom presenting with iron deficiency anemia - colonoscopy and EGD finding chronic blood loss related to L hiatal hernia. Now s/p multiple transfusions on 1/13-14.   Found to have scattered peripheral LL PE without RHS or central PE. On duplex has age indeterminate DVT in L peroneal veins. Hgb 9.7, plt 267. No s/sx of bleeding.   Goal of Therapy:  Heparin level 0.3-0.5 units/ml given recent anemia  Monitor platelets by anticoagulation protocol: Yes   Plan:  Give 4450 units bolus x 1 Start heparin infusion at 1450 units/hr Check anti-Xa level in 8 hours and  daily while on heparin Continue to monitor H&H and platelets  08-13-1979, PharmD, BCCCP Clinical Pharmacist  Phone: [phone]  Please check AMION for all [facility] phone numbers After 10:00 PM, call Main Pharmacy [phone] 03/21/2019,3:35 PM

## 2019-03-21 NOTE — Anesthesia Postprocedure Evaluation (Signed)
Anesthesia Post Note  Patient: Joshua Estrada  Procedure(s) Performed: COLONOSCOPY WITH PROPOFOL (N/A ) ESOPHAGOGASTRODUODENOSCOPY (EGD) WITH PROPOFOL (N/A ) POLYPECTOMY BIOPSY     Patient location during evaluation: PACU Anesthesia Type: MAC Level of consciousness: awake and alert Pain management: pain level controlled Vital Signs Assessment: post-procedure vital signs reviewed and stable Respiratory status: spontaneous breathing, nonlabored ventilation, respiratory function stable and patient connected to nasal cannula oxygen Cardiovascular status: stable and blood pressure returned to baseline Postop Assessment: no apparent nausea or vomiting Anesthetic complications: no    Last Vitals:  Vitals:   03/21/19 0920 03/21/19 0934  BP: (!) 122/57 (!) 124/98  Pulse: 100 (!) 50  Resp: 18 18  Temp:  36.8 C  SpO2: 95% 100%    Last Pain:  Vitals:   03/21/19 0934  TempSrc: Oral  PainSc:                  [name]

## 2019-03-21 NOTE — Progress Notes (Signed)
Progress Note    Joshua Estrada  HKV:[id number] DOB: 08-Jul-1940  DOA: 03/18/2019 PCP: Johny Blamer, MD    Brief Narrative:    Medical records reviewed and are as summarized below:  Joshua Estrada is an 79 y.o. male with medical history significant of hyperlipidemia; who presents after being told that he had an low blood counts.  Patient had followed up with cardiology after his primary care provider had obtained a chest x-ray which noted cardiomegaly.  Over the last 2 months he had been experiencing progressive exertional dyspnea.  Complains of being unable to walk or do anything without needing to rest.  Associated symptoms include lower extremity swelling.  He has not had any dark or bloody stools to his knowledge.  Patient takes a daily aspirin when he remembers, but is not on any other anticoagulation.  Found to have severe FE def anemia due to probable hiatal hernia, DVT/PE  Assessment/Plan:   Principal Problem:   Iron deficiency anemia due to chronic blood loss Active Problems:   Leukopenia   Cardiomegaly   GERD (gastroesophageal reflux disease)   BPH (benign prostatic hyperplasia)   Hyperlipidemia   Severe anemia   Iron deficiency anemia due to chronic blood loss, vitamin B12 deficiency: Acute.  Patient presents with hemoglobin 4.8 with low MCV and MCH.   -Patient uses aspirin, but not on any other blood thinners.  Stool guaiacs were noted to be negative.  Anemia panel revealed iron  6, TIBC 543,  ferritin 3, and vitamin B12 128.  - 3-5 units of packed red blood cells- documentation not clear -Feraheme transfusion x 1 -IM b12 x 1 dose and then Vitamin B12 1000 mcg daily - s/p colonoscopy and EGD: Patient may have chronic blood loss related to the large hiatal hernia and will benefit from iron supplementation and PPI use.  Incidental left leg DVT but CTA positive for multiple PEs -distal (below the knee)-- appears to be age indeterminate  -will do trial of heparin gtt (may  also need to be on anticoagulation for a fib as well) -if does not tolerate heparin gtt may need to discuss with pulm  Cardiomegaly: Patient reported having a chest x-ray which showed cardiomegaly outpatient setting by his primary care provider.  He had been sent to Dr. Bjorn Pippin of cardiology who had wanted to obtain an echocardiogram, Doppler ultrasound of the lower extremities, and obtain a myocardial perfusion study. -echocardiogram: Left ventricular ejection fraction, by visual estimation, is 55 to 60%. The left ventricle has normal function. Left ventricular septal wall thickness was mildly increased. Mildly increased left ventricular posterior wall thickness. There is mildly increased left ventricular hypertrophy.  GERD -Continue PPI  BPH -Continue Flomax  Hyperlipidemia: Home medications include simvastatin. -Continue Zocor   Family Communication/Anticipated D/C date and plan/Code Status   DVT prophylaxis: Code Status: Full Code.  Family Communication:  Disposition Plan: heparin gtt   Medical Consultants:    Gi.  cards   Subjective:    No current complaints-- seems overwhelmed with medical issues  Objective:    Vitals:   03/21/19 0910 03/21/19 0915 03/21/19 0920 03/21/19 0934  BP:   (!) 122/57 (!) 124/98  Pulse: 88 (!) 47 100 (!) 50  Resp: 20 17 18 18   Temp:    98.2 F (36.8 C)  TempSrc:    Oral  SpO2: 95% 91% 95% 100%  Weight:      Height:        Intake/Output Summary (Last 24 hours)  at 03/21/2019 1526 Last data filed at 03/21/2019 0850 Gross per 24 hour  Intake 603 ml  Output -  Net 603 ml   Filed Weights   03/19/19 0107 03/21/19 0725  Weight: 89.7 kg 89.7 kg    Exam: In bed, pleasant and cooperative rrr No wheezing +BS, soft, NT A+Ox3  Data Reviewed:   I have personally reviewed following labs and imaging studies:  Labs: Labs show the following:   Basic Metabolic Panel: Recent Labs  Lab 03/17/19 1538 03/17/19 1538 03/18/19  0656 03/18/19 0656 03/19/19 1040 03/19/19 1040 03/20/19 0250 03/21/19 0259  NA 140  --  139  --  139  --  142 140  K 4.8   < > 4.2   < > 3.6   < > 3.8 3.7  CL 107*  --  108  --  106  --  110 107  CO2 20  --  21*  --  24  --  24 26  GLUCOSE 106*  --  111*  --  80  --  97 99  BUN 14  --  21  --  10  --  7* <5*  CREATININE 1.13  --  1.18  --  0.99  --  1.06 0.89  CALCIUM 8.9  --  9.1  --  8.9  --  8.8* 9.1   < > = values in this interval not displayed.   GFR Estimated Creatinine Clearance: 82.1 mL/min (by C-G formula based on SCr of 0.89 mg/dL). Liver Function Tests: Recent Labs  Lab 03/17/19 1538 03/18/19 0656  AST 11 16  ALT 10 15  ALKPHOS 78 66  BILITOT 0.4 0.8  PROT 6.4 7.1  ALBUMIN 4.5 4.4   No results for input(s): LIPASE, AMYLASE in the last 168 hours. No results for input(s): AMMONIA in the last 168 hours. Coagulation profile Recent Labs  Lab 03/18/19 1724  INR 1.1    CBC: Recent Labs  Lab 03/17/19 1538 03/18/19 0839 03/18/19 0839 03/18/19 1728 03/18/19 1952 03/19/19 1040 03/20/19 0250 03/21/19 0259  WBC 4.0 3.5*  --   --   --  4.3 4.9 4.8  NEUTROABS  --  2.0  --   --   --   --   --   --   HGB 4.2* 4.3*   < > 7.0* 6.7* 8.6* 9.3* 9.7*  HCT 16.6* 17.4*   < > 23.8* 23.1* 28.6* 31.7* 33.2*  MCV 67* 69.9*  --   --   --  74.9* 77.5* 76.9*  PLT 397 331  --   --   --  303 319 267   < > = values in this interval not displayed.   Cardiac Enzymes: No results for input(s): CKTOTAL, CKMB, CKMBINDEX, TROPONINI in the last 168 hours. BNP (last 3 results) Recent Labs    03/17/19 1538  PROBNP 614*   CBG: No results for input(s): GLUCAP in the last 168 hours. D-Dimer: No results for input(s): DDIMER in the last 72 hours. Hgb A1c: No results for input(s): HGBA1C in the last 72 hours. Lipid Profile: No results for input(s): CHOL, HDL, LDLCALC, TRIG, CHOLHDL, LDLDIRECT in the last 72 hours. Thyroid function studies: Recent Labs    03/18/19 1724  TSH  1.383   Anemia work up: No results for input(s): VITAMINB12, FOLATE, FERRITIN, TIBC, IRON, RETICCTPCT in the last 72 hours. Sepsis Labs: Recent Labs  Lab 03/18/19 0839 03/19/19 1040 03/20/19 0250 03/21/19 0259  WBC 3.5* 4.3 4.9  4.8    Microbiology Recent Results (from the past 240 hour(s))  SARS CORONAVIRUS 2 (TAT 6-24 HRS) Nasopharyngeal Nasopharyngeal Swab     Status: None   Collection Time: 03/18/19  8:39 AM   Specimen: Nasopharyngeal Swab  Result Value Ref Range Status   SARS Coronavirus 2 NEGATIVE NEGATIVE Final    Comment: (NOTE) SARS-CoV-2 target nucleic acids are NOT DETECTED. The SARS-CoV-2 RNA is generally detectable in upper and lower respiratory specimens during the acute phase of infection. Negative results do not preclude SARS-CoV-2 infection, do not rule out co-infections with other pathogens, and should not be used as the sole basis for treatment or other patient management decisions. Negative results must be combined with clinical observations, patient history, and epidemiological information. The expected result is Negative. Fact Sheet for Patients: HairSlick.no Fact Sheet for Healthcare Providers: quierodirigir.com This test is not yet approved or cleared by the [place] FDA and  has been authorized for detection and/or diagnosis of SARS-CoV-2 by FDA under an Emergency Use Authorization (EUA). This EUA will remain  in effect (meaning this test can be used) for the duration of the COVID-19 declaration under Section 56 4(b)(1) of the Act, 21 U.S.C. section 360bbb-3(b)(1), unless the authorization is terminated or revoked sooner. Performed at [facility], [address] [place], [place] [address]     Procedures and diagnostic studies:  CT ANGIO CHEST PE W OR WO CONTRAST  Result Date: 03/21/2019 CLINICAL DATA:  Deep venous thrombosis and chest pain. EXAM: CT ANGIOGRAPHY CHEST WITH  CONTRAST TECHNIQUE: Multidetector CT imaging of the chest was performed using the standard protocol during bolus administration of intravenous contrast. Multiplanar CT image reconstructions and MIPs were obtained to evaluate the vascular anatomy. CONTRAST:  31mL OMNIPAQUE IOHEXOL 350 MG/ML SOLN COMPARISON:  None. FINDINGS: Cardiovascular: The heart is normal in size. No pericardial effusion. The aorta is normal in caliber. No dissection. Minimal atherosclerotic calcifications at the aortic arch. The branch vessels are patent. Scattered coronary artery calcifications. The pulmonary arterial tree is well opacified. There are scattered peripheral filling defects consistent with pulmonary emboli. No large central pulmonary emboli. No findings for right heart strain. Mediastinum/Nodes: No mediastinal or hilar mass or adenopathy. Small scattered lymph nodes are noted. There is a moderate to large hiatal hernia. Lungs/Pleura: Moderate breathing motion artifact. No worrisome pulmonary lesions or pulmonary infiltrates. Very small bilateral pleural effusions and bibasilar atelectasis. Upper Abdomen: No significant upper abdominal findings. Musculoskeletal: No chest wall mass, supraclavicular or axillary adenopathy. The bony thorax is intact. Mild degenerative changes involving the thoracic spine and upper lumbar spine. Review of the MIP images confirms the above findings. IMPRESSION: 1. Scattered peripheral lower lobe pulmonary emboli but no large central pulmonary emboli and no findings for right heart strain. 2. Normal thoracic aorta. 3. Very small bilateral pleural effusions and bibasilar atelectasis. 4. Moderate to large hiatal hernia. These results will be called to the ordering clinician or representative by the Radiologist Assistant, and communication documented in the PACS or zVision Dashboard. Electronically Signed   By: Rudie Meyer M.D.   On: 03/21/2019 12:43    Medications:   . sodium chloride   Intravenous  Once  . famotidine  20 mg Oral BID  . ferrous sulfate  325 mg Oral BID WC  . pantoprazole  40 mg Oral Daily  . simvastatin  20 mg Oral QHS  . sodium chloride flush  3 mL Intravenous Q12H  . tamsulosin  0.4 mg Oral QHS  .  vitamin B-12  1,000 mcg Oral Daily   Continuous Infusions:    LOS: 2 days   Geradine Girt  [facility]   How to contact the [facility] Attending or Consulting provider [place] or covering provider during after hours [place], for this patient?  1. Check the care team in [facility] and look for a) attending/consulting [facility] provider listed and b) the [facility] team listed 2. Log into [email or web address] and use [place]'s universal password to access. If you do not have the password, please contact the hospital operator. 3. Locate the [facility] provider you are looking for under [facility] and page to a number that you can be directly reached. 4. If you still have difficulty reaching the provider, please page the [facility] (Director on Call) for the Hospitalists listed on amion for assistance.  03/21/2019, 3:26 PM

## 2019-03-21 NOTE — Progress Notes (Signed)
CCMD notified nurse of patients HR in the 140s with Atrial fibrillation.  Heart rate in 140s occurs when patient is walking to the restroom.  Patient states "He feels fine", does not feel like his heartrate is increasing, and denies any chest pain or shortness of breathe.  Heart rate 90s-110s at rest  [place] on-call notified  Will continue to monitor.

## 2019-03-22 LAB — CBC
HCT: 31 % — ABNORMAL LOW (ref 39.0–52.0)
Hemoglobin: 8.7 g/dL — ABNORMAL LOW (ref 13.0–17.0)
MCH: 22.5 pg — ABNORMAL LOW (ref 26.0–34.0)
MCHC: 28.1 g/dL — ABNORMAL LOW (ref 30.0–36.0)
MCV: 80.3 fL (ref 80.0–100.0)
Platelets: 277 10*3/uL (ref 150–400)
RBC: 3.86 MIL/uL — ABNORMAL LOW (ref 4.22–5.81)
RDW: 25.2 % — ABNORMAL HIGH (ref 11.5–15.5)
WBC: 6.1 10*3/uL (ref 4.0–10.5)
nRBC: 0 % (ref 0.0–0.2)

## 2019-03-22 LAB — BASIC METABOLIC PANEL
Anion gap: 6 (ref 5–15)
BUN: 9 mg/dL (ref 8–23)
CO2: 27 mmol/L (ref 22–32)
Calcium: 8.7 mg/dL — ABNORMAL LOW (ref 8.9–10.3)
Chloride: 108 mmol/L (ref 98–111)
Creatinine, Ser: 0.92 mg/dL (ref 0.61–1.24)
GFR calc Af Amer: >60 mL/min (ref >60)
GFR calc non Af Amer: >60 mL/min (ref >60)
Glucose, Bld: 124 mg/dL — ABNORMAL HIGH (ref 70–99)
Potassium: 3.7 mmol/L (ref 3.5–5.1)
Sodium: 141 mmol/L (ref 135–145)

## 2019-03-22 LAB — HEPARIN LEVEL (UNFRACTIONATED)
Heparin Unfractionated: 0.45 IU/mL (ref 0.30–0.70)
Heparin Unfractionated: 0.41 IU/mL (ref 0.30–0.70)

## 2019-03-22 LAB — HEMOGLOBIN AND HEMATOCRIT, BLOOD
HCT: 30.5 % — ABNORMAL LOW (ref 39.0–52.0)
Hemoglobin: 8.6 g/dL — ABNORMAL LOW (ref 13.0–17.0)

## 2019-03-22 MED ORDER — SODIUM CHLORIDE 0.9 % IV SOLN
510.0000 mg | Freq: Once | INTRAVENOUS | Status: AC
  Administered 2019-03-22: 510 mg via INTRAVENOUS
  Filled 2019-03-22: qty 17

## 2019-03-22 MED ORDER — FERROUS SULFATE 325 (65 FE) MG PO TABS
325.0000 mg | ORAL_TABLET | Freq: Two times a day (BID) | ORAL | Status: DC
  Administered 2019-03-23 – 2019-03-24 (×3): 325 mg via ORAL
  Filled 2019-03-22 (×3): qty 1

## 2019-03-22 NOTE — Progress Notes (Signed)
ANTICOAGULATION CONSULT NOTE  Pharmacy Consult for heparin Indication: pulmonary embolus  No Known Allergies  Patient Measurements: Height: 6\' 2"  (188 cm) Weight: 197 lb 12 oz (89.7 kg) IBW/kg (Calculated) : 82.2 Heparin Dosing Weight: 89.7 kg   Vital Signs: Temp: 97.4 F (36.3 C) (01/16 1925) Temp Source: Oral (01/16 1925) BP: 112/72 (01/16 1925) Pulse Rate: 58 (01/16 1925)  Labs: Recent Labs    03/20/19 0250 03/20/19 0250 03/21/19 0259 03/22/19 0101  HGB 9.3*   < > 9.7* 8.7*  HCT 31.7*  --  33.2* 31.0*  PLT 319  --  267 277  HEPARINUNFRC  --   --   --  0.45  CREATININE 1.06  --  0.89 0.92   < > = values in this interval not displayed.    Estimated Creatinine Clearance: 79.4 mL/min (by C-G formula based on SCr of 0.92 mg/dL).  Assessment: 79 y.o. male with PE/DVT for heparin  Goal of Therapy:  Heparin level 0.3-0.5 units/ml given recent anemia  Monitor platelets by anticoagulation protocol: Yes   Plan:  Continue Heparin at current rate  73, PharmD, BCPS  03/22/2019,3:44 AM

## 2019-03-22 NOTE — Progress Notes (Signed)
ANTICOAGULATION CONSULT NOTE - Follow-Up Consult  Pharmacy Consult for heparin Indication: pulmonary embolus  No Known Allergies  Patient Measurements: Height: 6\' 2"  (188 cm) Weight: 197 lb 12 oz (89.7 kg) IBW/kg (Calculated) : 82.2 Heparin Dosing Weight: 89.7 kg   Vital Signs: Temp: 97.8 F (36.6 C) (01/17 0346) BP: 112/69 (01/17 0346) Pulse Rate: 52 (01/17 0346)  Labs: Recent Labs    03/20/19 0250 03/20/19 0250 03/21/19 0259 03/22/19 0101  HGB 9.3*   < > 9.7* 8.7*  HCT 31.7*  --  33.2* 31.0*  PLT 319  --  267 277  HEPARINUNFRC  --   --   --  0.45  CREATININE 1.06  --  0.89 0.92   < > = values in this interval not displayed.    Estimated Creatinine Clearance: 79.4 mL/min (by C-G formula based on SCr of 0.92 mg/dL).   Medical History: Past Medical History:  Diagnosis Date  . BPH (benign prostatic hyperplasia)   . Hyperlipidemia     Medications:  Scheduled:  . sodium chloride   Intravenous Once  . famotidine  20 mg Oral BID  . ferrous sulfate  325 mg Oral BID WC  . pantoprazole  40 mg Oral Daily  . simvastatin  20 mg Oral QHS  . sodium chloride flush  3 mL Intravenous Q12H  . tamsulosin  0.4 mg Oral QHS  . vitamin B-12  1,000 mcg Oral Daily    Assessment: 17 YOM presenting with iron deficiency anemia - colonoscopy and EGD finding chronic blood loss related to L hiatal hernia. Now s/p multiple transfusions on 1/13-14. The patient was found to have scattered peripheral LL PE without RHS or central PE. On duplex has age indeterminate DVT in L peroneal veins. Pharmacy consulted to start Heparin for anticoagulation.   Heparin level this morning remains therapeutic (HL 0.41 << 0.45, goal of 0.3-0.5). Aiming for low goal and monitoring closely given recent profound anemia. Hgb 8.7 << 9.7 - will watch closely, no active bleeding noted.   Goal of Therapy:  Heparin level 0.3-0.5 units/ml given recent anemia  Monitor platelets by anticoagulation protocol: Yes    Plan:  - Continue Heparin 1450 units/hr (14.5 ml/hr) - Will continue to monitor for any signs/symptoms of bleeding and will follow up with heparin level in the a.m.   Thank you for allowing pharmacy to be a part of this patient's care.  08-13-1977, PharmD, BCPS Clinical Pharmacist Clinical phone for 03/22/2019: 03/24/2019 03/22/2019 7:34 AM   **Pharmacist phone directory can now be found on [email or web address] (PW [email or web address]).  Listed under [facility].

## 2019-03-22 NOTE — Progress Notes (Signed)
Progress Note    [name]  XBD:[id number] DOB: [birth year]  DOA: 03/18/2019 PCP: Shirline Frees, MD    Brief Narrative:    Medical records reviewed and are as summarized below:  Joshua Estrada is an 79 y.o. male with medical history significant of hyperlipidemia; who presents after being told that he had an low blood counts.  Patient had followed up with cardiology after his primary care provider had obtained a chest x-ray which noted cardiomegaly.  Over the last 2 months he had been experiencing progressive exertional dyspnea.  Complains of being unable to walk or do anything without needing to rest.  Associated symptoms include lower extremity swelling.  He has not had any dark or bloody stools to his knowledge.  Patient takes a daily aspirin when he remembers, but is not on any other anticoagulation.  Found to have severe FE def anemia due to probable hiatal hernia, DVT/PE  Assessment/Plan:   Principal Problem:   Iron deficiency anemia due to chronic blood loss Active Problems:   Leukopenia   Cardiomegaly   GERD (gastroesophageal reflux disease)   BPH (benign prostatic hyperplasia)   Hyperlipidemia   Severe anemia   Iron deficiency anemia due to chronic blood loss, vitamin B12 deficiency: Acute.  Patient presents with hemoglobin 4.8 with low MCV and MCH.   -Patient uses aspirin, but not on any other blood thinners.  Stool guaiacs were noted to be negative.  Anemia panel revealed iron  6, TIBC 543,  ferritin 3, and vitamin B12 128.  - 3-5 units of packed red blood cells- documentation not clear -Feraheme transfusion x 1 and again on 1/17 -IM b12 x 1 dose and then Vitamin B12 1000 mcg daily - s/p colonoscopy and EGD: Patient may have chronic blood loss related to the large hiatal hernia and will benefit from iron supplementation and PPI use.  Incidental left leg DVT but CTA positive for multiple PEs -distal (below the knee)-- appears to be age indeterminate  -will do trial of  heparin gtt (may also need to be on anticoagulation for a fib as well) -Hemoglobin dropped some overnight but will recheck this afternoon and leave on heparin drip for now  Cardiomegaly: Patient reported having a chest x-ray which showed cardiomegaly outpatient setting by his primary care provider.  He had been sent to Dr. Gardiner Rhyme of cardiology who had wanted to obtain an echocardiogram, Doppler ultrasound of the lower extremities, and obtain a myocardial perfusion study. -echocardiogram: Left ventricular ejection fraction, by visual estimation, is 55 to 60%. The left ventricle has normal function. Left ventricular septal wall thickness was mildly increased. Mildly increased left ventricular posterior wall thickness. There is mildly increased left ventricular hypertrophy.  GERD -Continue PPI  BPH -Continue Flomax  Hyperlipidemia: Home medications include simvastatin. -Continue Zocor  SVT/Afib with alternating bradycardia. -symptomatic with this.  Per cardiology:  -CHADSVASC score is at least 2 for age >74, however, given the brevity of his afib and recent profound anemia, would not recommend anticoagulation for this now.  -30-day outpatient monitor and follow-up with Dr. Gardiner Rhyme afterwards.  - Given his bradycardia, would not recommend AVN blocker at this point.  Family Communication/Anticipated D/C date and plan/Code Status   DVT prophylaxis: Code Status: Full Code.  Family Communication:  Disposition Plan: heparin gtt   Medical Consultants:    Gi.  cards   Subjective:    No further BMs since prior to colonoscopy  Objective:    Vitals:   03/21/19 1925 03/22/19  8416 03/22/19 0852 03/22/19 1257  BP: 112/72 112/69 123/74 (!) 98/55  Pulse: (!) 58 (!) 52 (!) 55 65  Resp: 17 17 15 16   Temp: (!) 97.4 F (36.3 C) 97.8 F (36.6 C) 98 F (36.7 C) (!) 97.3 F (36.3 C)  TempSrc: Oral  Oral Oral  SpO2: 96% 96% 96% 96%  Weight:      Height:        Intake/Output  Summary (Last 24 hours) at 03/22/2019 1359 Last data filed at 03/22/2019 0900 Gross per 24 hour  Intake 430.77 ml  Output 300 ml  Net 130.77 ml   Filed Weights   03/19/19 0107 03/21/19 0725  Weight: 89.7 kg 89.7 kg    Exam: In bed, NAD rrr No increased work of breathing No LE edema  Data Reviewed:   I have personally reviewed following labs and imaging studies:  Labs: Labs show the following:   Basic Metabolic Panel: Recent Labs  Lab 03/18/19 0656 03/18/19 0656 03/19/19 1040 03/19/19 1040 03/20/19 0250 03/20/19 0250 03/21/19 0259 03/22/19 0101  NA 139  --  139  --  142  --  140 141  K 4.2   < > 3.6   < > 3.8   < > 3.7 3.7  CL 108  --  106  --  110  --  107 108  CO2 21*  --  24  --  24  --  26 27  GLUCOSE 111*  --  80  --  97  --  99 124*  BUN 21  --  10  --  7*  --  <5* 9  CREATININE 1.18  --  0.99  --  1.06  --  0.89 0.92  CALCIUM 9.1  --  8.9  --  8.8*  --  9.1 8.7*   < > = values in this interval not displayed.   GFR Estimated Creatinine Clearance: 79.4 mL/min (by C-G formula based on SCr of 0.92 mg/dL). Liver Function Tests: Recent Labs  Lab 03/17/19 1538 03/18/19 0656  AST 11 16  ALT 10 15  ALKPHOS 78 66  BILITOT 0.4 0.8  PROT 6.4 7.1  ALBUMIN 4.5 4.4   No results for input(s): LIPASE, AMYLASE in the last 168 hours. No results for input(s): AMMONIA in the last 168 hours. Coagulation profile Recent Labs  Lab 03/18/19 1724  INR 1.1    CBC: Recent Labs  Lab 03/18/19 0839 03/18/19 1728 03/18/19 1952 03/19/19 1040 03/20/19 0250 03/21/19 0259 03/22/19 0101  WBC 3.5*  --   --  4.3 4.9 4.8 6.1  NEUTROABS 2.0  --   --   --   --   --   --   HGB 4.3*   < > 6.7* 8.6* 9.3* 9.7* 8.7*  HCT 17.4*   < > 23.1* 28.6* 31.7* 33.2* 31.0*  MCV 69.9*  --   --  74.9* 77.5* 76.9* 80.3  PLT 331  --   --  303 319 267 277   < > = values in this interval not displayed.   Cardiac Enzymes: No results for input(s): CKTOTAL, CKMB, CKMBINDEX, TROPONINI in the  last 168 hours. BNP (last 3 results) Recent Labs    03/17/19 1538  PROBNP 614*   CBG: No results for input(s): GLUCAP in the last 168 hours. D-Dimer: No results for input(s): DDIMER in the last 72 hours. Hgb A1c: No results for input(s): HGBA1C in the last 72 hours. Lipid Profile: No results for  input(s): CHOL, HDL, LDLCALC, TRIG, CHOLHDL, LDLDIRECT in the last 72 hours. Thyroid function studies: No results for input(s): TSH, T4TOTAL, T3FREE, THYROIDAB in the last 72 hours.  Invalid input(s): FREET3 Anemia work up: No results for input(s): VITAMINB12, FOLATE, FERRITIN, TIBC, IRON, RETICCTPCT in the last 72 hours. Sepsis Labs: Recent Labs  Lab 03/19/19 1040 03/20/19 0250 03/21/19 0259 03/22/19 0101  WBC 4.3 4.9 4.8 6.1    Microbiology Recent Results (from the past 240 hour(s))  SARS CORONAVIRUS 2 (TAT 6-24 HRS) Nasopharyngeal Nasopharyngeal Swab     Status: None   Collection Time: 03/18/19  8:39 AM   Specimen: Nasopharyngeal Swab  Result Value Ref Range Status   SARS Coronavirus 2 NEGATIVE NEGATIVE Final    Comment: (NOTE) SARS-CoV-2 target nucleic acids are NOT DETECTED. The SARS-CoV-2 RNA is generally detectable in upper and lower respiratory specimens during the acute phase of infection. Negative results do not preclude SARS-CoV-2 infection, do not rule out co-infections with other pathogens, and should not be used as the sole basis for treatment or other patient management decisions. Negative results must be combined with clinical observations, patient history, and epidemiological information. The expected result is Negative. Fact Sheet for Patients: HairSlick.no Fact Sheet for Healthcare Providers: quierodirigir.com This test is not yet approved or cleared by the [place] FDA and  has been authorized for detection and/or diagnosis of SARS-CoV-2 by FDA under an Emergency Use Authorization (EUA). This  EUA will remain  in effect (meaning this test can be used) for the duration of the COVID-19 declaration under Section 56 4(b)(1) of the Act, 21 U.S.C. section 360bbb-3(b)(1), unless the authorization is terminated or revoked sooner. Performed at [facility], [address], [place], [place] [address]     Procedures and diagnostic studies:  CT ANGIO CHEST PE W OR WO CONTRAST  Result Date: 03/21/2019 CLINICAL DATA:  Deep venous thrombosis and chest pain. EXAM: CT ANGIOGRAPHY CHEST WITH CONTRAST TECHNIQUE: Multidetector CT imaging of the chest was performed using the standard protocol during bolus administration of intravenous contrast. Multiplanar CT image reconstructions and MIPs were obtained to evaluate the vascular anatomy. CONTRAST:  19mL OMNIPAQUE IOHEXOL 350 MG/ML SOLN COMPARISON:  None. FINDINGS: Cardiovascular: The heart is normal in size. No pericardial effusion. The aorta is normal in caliber. No dissection. Minimal atherosclerotic calcifications at the aortic arch. The branch vessels are patent. Scattered coronary artery calcifications. The pulmonary arterial tree is well opacified. There are scattered peripheral filling defects consistent with pulmonary emboli. No large central pulmonary emboli. No findings for right heart strain. Mediastinum/Nodes: No mediastinal or hilar mass or adenopathy. Small scattered lymph nodes are noted. There is a moderate to large hiatal hernia. Lungs/Pleura: Moderate breathing motion artifact. No worrisome pulmonary lesions or pulmonary infiltrates. Very small bilateral pleural effusions and bibasilar atelectasis. Upper Abdomen: No significant upper abdominal findings. Musculoskeletal: No chest wall mass, supraclavicular or axillary adenopathy. The bony thorax is intact. Mild degenerative changes involving the thoracic spine and upper lumbar spine. Review of the MIP images confirms the above findings. IMPRESSION: 1. Scattered peripheral lower lobe  pulmonary emboli but no large central pulmonary emboli and no findings for right heart strain. 2. Normal thoracic aorta. 3. Very small bilateral pleural effusions and bibasilar atelectasis. 4. Moderate to large hiatal hernia. These results will be called to the ordering clinician or representative by the Radiologist Assistant, and communication documented in the PACS or zVision Dashboard. Electronically Signed   By: Orlene Plum.D.  On: 03/21/2019 12:43    Medications:   . sodium chloride   Intravenous Once  . famotidine  20 mg Oral BID  . [START ON 03/23/2019] ferrous sulfate  325 mg Oral BID WC  . pantoprazole  40 mg Oral Daily  . simvastatin  20 mg Oral QHS  . sodium chloride flush  3 mL Intravenous Q12H  . tamsulosin  0.4 mg Oral QHS  . vitamin B-12  1,000 mcg Oral Daily   Continuous Infusions: . ferumoxytol    . heparin 1,450 Units/hr (03/22/19 0654)     LOS: 3 days   Joseph Art  [facility]   How to contact the [facility] Attending or Consulting provider 7A - 7P or covering provider during after hours 7P -7A, for this patient?  1. Check the care team in [facility] and look for a) attending/consulting TR[facility] provider listed and b) the [facility] team listed 2. Log into [email or web address] and use [facility]'s universal password to access. If you do not have the password, please contact the hospital operator. 3. Locate the [facility] provider you are looking for under [facility] and page to a number that you can be directly reached. 4. If you still have difficulty reaching the provider, please page the [facility] (Director on Call) for the Hospitalists listed on amion for assistance.  03/22/2019, 1:59 PM

## 2019-03-22 NOTE — Plan of Care (Signed)
  Problem: Education: Goal: Knowledge of General Education information will improve Description: Including pain rating scale, medication(s)/side effects and non-pharmacologic comfort measures Outcome: Progressing   Problem: Clinical Measurements: Goal: Ability to maintain clinical measurements within normal limits will improve Outcome: Progressing Goal: Cardiovascular complication will be avoided Outcome: Progressing   Problem: Activity: Goal: Risk for activity intolerance will decrease Outcome: Progressing   Problem: Safety: Goal: Ability to remain free from injury will improve Outcome: Progressing

## 2019-03-23 ENCOUNTER — Telehealth: Payer: Self-pay | Admitting: *Deleted

## 2019-03-23 LAB — CBC
HCT: 30.8 % — ABNORMAL LOW (ref 39.0–52.0)
Hemoglobin: 8.8 g/dL — ABNORMAL LOW (ref 13.0–17.0)
MCH: 23.3 pg — ABNORMAL LOW (ref 26.0–34.0)
MCHC: 28.6 g/dL — ABNORMAL LOW (ref 30.0–36.0)
MCV: 81.7 fL (ref 80.0–100.0)
Platelets: 236 10*3/uL (ref 150–400)
RBC: 3.77 MIL/uL — ABNORMAL LOW (ref 4.22–5.81)
RDW: 26.1 % — ABNORMAL HIGH (ref 11.5–15.5)
WBC: 4.3 10*3/uL (ref 4.0–10.5)
nRBC: 0 % (ref 0.0–0.2)

## 2019-03-23 LAB — HEPARIN LEVEL (UNFRACTIONATED): Heparin Unfractionated: 1.08 IU/mL — ABNORMAL HIGH (ref 0.30–0.70)

## 2019-03-23 MED ORDER — APIXABAN 5 MG PO TABS: 5.0000 mg | ORAL_TABLET | Freq: Two times a day (BID) | ORAL | Status: DC

## 2019-03-23 MED ORDER — APIXABAN 5 MG PO TABS
10.0000 mg | ORAL_TABLET | Freq: Two times a day (BID) | ORAL | Status: DC
  Administered 2019-03-23 – 2019-03-24 (×3): 10 mg via ORAL
  Filled 2019-03-23 (×3): qty 2

## 2019-03-23 MED ORDER — APIXABAN (ELIQUIS) VTE STARTER PACK (10MG AND 5MG): ORAL_TABLET | ORAL | 0 refills | Status: DC

## 2019-03-23 MED ORDER — HEPARIN (PORCINE) 25000 UT/250ML-% IV SOLN: 1250.0000 [IU]/h | INTRAVENOUS | Status: DC

## 2019-03-23 NOTE — Progress Notes (Signed)
Ambulated patient in hallway, o2 sats were 98 % on room air. Denied sob or chest pain. He did states He felt a "little " off balance, but said he wasnt dizzy Notified MD Dr. Benjamine Mola, he is now back in bed and has no voiced complaints.

## 2019-03-23 NOTE — Progress Notes (Signed)
Heparin drip started back at 0744 12.38ml/hr

## 2019-03-23 NOTE — Telephone Encounter (Signed)
[employer] to ship a 30 day cardiac event monitor to the patients home.  Instructions will be included in the monitor kit. 

## 2019-03-23 NOTE — Progress Notes (Signed)
ANTICOAGULATION CONSULT NOTE - Follow-Up Consult  Pharmacy Consult for Heparin Indication: pulmonary embolus  No Known Allergies  Patient Measurements: Height: 6\' 2"  (188 cm) Weight: 197 lb 12 oz (89.7 kg) IBW/kg (Calculated) : 82.2 Heparin Dosing Weight: 89.7 kg   Vital Signs: Temp: 98.6 F (37 C) (01/18 0348) Temp Source: Oral (01/17 1926) BP: 127/64 (01/18 0348) Pulse Rate: 49 (01/18 0348)  Labs: Recent Labs    03/21/19 0259 03/21/19 0259 03/22/19 0101 03/22/19 0101 03/22/19 1006 03/22/19 1405 03/23/19 0444  HGB 9.7*   < > 8.7*   < >  --  8.6* 8.8*  HCT 33.2*   < > 31.0*  --   --  30.5* 30.8*  PLT 267  --  277  --   --   --  236  HEPARINUNFRC  --   --  0.45  --  0.41  --  1.08*  CREATININE 0.89  --  0.92  --   --   --   --    < > = values in this interval not displayed.    Estimated Creatinine Clearance: 79.4 mL/min (by C-G formula based on SCr of 0.92 mg/dL).   Medical History: Past Medical History:  Diagnosis Date  . BPH (benign prostatic hyperplasia)   . Hyperlipidemia     Medications:  Scheduled:  . sodium chloride   Intravenous Once  . famotidine  20 mg Oral BID  . ferrous sulfate  325 mg Oral BID WC  . pantoprazole  40 mg Oral Daily  . simvastatin  20 mg Oral QHS  . sodium chloride flush  3 mL Intravenous Q12H  . tamsulosin  0.4 mg Oral QHS  . vitamin B-12  1,000 mcg Oral Daily    Assessment: 80 YOM presenting with iron deficiency anemia - colonoscopy and EGD finding chronic blood loss related to L hiatal hernia. Now s/p multiple transfusions on 1/13-14. The patient was found to have scattered peripheral LL PE without RHS or central PE. On duplex has age indeterminate DVT in L peroneal veins. Pharmacy consulted to start Heparin for anticoagulation.   Heparin level this morning remains therapeutic (HL 0.41 << 0.45, goal of 0.3-0.5). Aiming for low goal and monitoring closely given recent profound anemia. Hgb 8.7 << 9.7 - will watch closely, no  active bleeding noted.   1/18 AM update:  Heparin level above goal this AM Drawn from opposite arm of heparin infusion Hgb remains low but stable  Goal of Therapy:  Heparin level 0.3-0.5 units/ml given recent anemia  Monitor platelets by anticoagulation protocol: Yes   Plan:  -Hold heparin x 1 hr -Re-start heparin drip at 1250 units/hr at 0700 -1500 heparin level   2/18, PharmD, BCPS Clinical Pharmacist Phone: [phone]

## 2019-03-23 NOTE — Progress Notes (Signed)
Transitions of Care Pharmacist Note  Joshua Estrada is a 79 y.o. male that has been diagnosed with PE and will be prescribed Eliquis (apixaban) at discharge.   Patient Education: I provided the following education on 03/23/2019 to the patient: How to take the medication Described what the medication is Signs of bleeding Signs/symptoms of VTE and stroke  Answered their questions  Discharge Medications Plan: The patient wants to have their discharge medications filled by the Transitions of Care pharmacy rather than their usual pharmacy.  The discharge orders pharmacy has been changed to the Transitions of Care pharmacy, the patient will receive a phone call regarding co-pay, and their medications will be delivered by the Transitions of Care pharmacy.    Thank you,   Lulu Riding, PharmD PGY1 Pharmacy Resident  Please check AMION for all [facility] Pharmacy phone numbers After 10:00 PM, call Main Pharmacy [phone]  March 23, 2019

## 2019-03-23 NOTE — Progress Notes (Signed)
ANTICOAGULATION CONSULT NOTE - Follow-Up Consult  Pharmacy Consult for Heparin to apixaban Indication: pulmonary embolus  No Known Allergies  Patient Measurements: Height: 6\' 2"  (188 cm) Weight: 197 lb 12 oz (89.7 kg) IBW/kg (Calculated) : 82.2 Heparin Dosing Weight: 89.7 kg   Vital Signs: Temp: 98 F (36.7 C) (01/18 1354) Temp Source: Oral (01/18 1354) BP: 122/103 (01/18 1354) Pulse Rate: 85 (01/18 1354)  Labs: Recent Labs    03/21/19 0259 03/21/19 0259 03/22/19 0101 03/22/19 0101 03/22/19 1006 03/22/19 1405 03/23/19 0444  HGB 9.7*   < > 8.7*   < >  --  8.6* 8.8*  HCT 33.2*   < > 31.0*  --   --  30.5* 30.8*  PLT 267  --  277  --   --   --  236  HEPARINUNFRC  --   --  0.45  --  0.41  --  1.08*  CREATININE 0.89  --  0.92  --   --   --   --    < > = values in this interval not displayed.    Estimated Creatinine Clearance: 79.4 mL/min (by C-G formula based on SCr of 0.92 mg/dL).   Medical History: Past Medical History:  Diagnosis Date  . BPH (benign prostatic hyperplasia)   . Hyperlipidemia     Medications:  Scheduled:  . sodium chloride   Intravenous Once  . famotidine  20 mg Oral BID  . ferrous sulfate  325 mg Oral BID WC  . pantoprazole  40 mg Oral Daily  . simvastatin  20 mg Oral QHS  . sodium chloride flush  3 mL Intravenous Q12H  . tamsulosin  0.4 mg Oral QHS  . vitamin B-12  1,000 mcg Oral Daily    Assessment: 71 YOM presenting with iron deficiency anemia - colonoscopy and EGD finding chronic blood loss related to L hiatal hernia. Now s/p multiple transfusions on 1/13-14. The patient was found to have scattered peripheral LL PE without RHS or central PE. On duplex has age indeterminate DVT in L peroneal veins. Pharmacy consulted to start Heparin for anticoagulation.   Pharmacy asked to transition heparin to apixaban. Cr is normal (<1 mg/dl) and H/H has improved, LFTs normal on admit.   Goal of Therapy:  Heparin level 0.3-0.5 units/ml given recent  anemia  Monitor platelets by anticoagulation protocol: Yes   Plan:  -Stop IV heparin -Begin apixaban 10mg  BID x7 days then 5mg  BID   08-13-1977, PharmD, BCPS Clinical Pharmacist [phone] Please check AMION for all [facility] [address] Pharmacy numbers 03/23/2019

## 2019-03-23 NOTE — Progress Notes (Signed)
Progress Note    Joshua Estrada  ZOX:[id number] DOB: [birth year]  DOA: 03/18/2019 PCP: Johny Blamer, MD    Brief Narrative:    Medical records reviewed and are as summarized below:  Joshua Estrada is an 79 y.o. male with medical history significant of hyperlipidemia; who presents after being told that he had an low blood counts.  Patient had followed up with cardiology after his primary care provider had obtained a chest x-ray which noted cardiomegaly.  Over the last 2 months he had been experiencing progressive exertional dyspnea.  Complains of being unable to walk or do anything without needing to rest.  Associated symptoms include lower extremity swelling.  He has not had any dark or bloody stools to his knowledge.  Patient takes a daily aspirin when he remembers, but is not on any other anticoagulation.  Found to have severe FE def anemia due to probable hiatal hernia, DVT/PE  Assessment/Plan:   Principal Problem:   Iron deficiency anemia due to chronic blood loss Active Problems:   Leukopenia   Cardiomegaly   GERD (gastroesophageal reflux disease)   BPH (benign prostatic hyperplasia)   Hyperlipidemia   Severe anemia   Iron deficiency anemia due to chronic blood loss, vitamin B12 deficiency: Acute.  Patient presents with hemoglobin 4.8 with low MCV and MCH.   -Patient uses aspirin, but not on any other blood thinners.  Stool guaiacs were noted to be negative.  Anemia panel revealed iron  6, TIBC 543,  ferritin 3, and vitamin B12 128.  - 3-5 units of packed red blood cells- documentation not clear -Feraheme transfusion x 1 and again on 1/17 -IM b12 x 1 dose and then Vitamin B12 1000 mcg daily - s/p colonoscopy and EGD: Patient may have chronic blood loss related to the large hiatal hernia and will benefit from iron supplementation and PPI use.  Incidental left leg DVT but CTA positive for multiple PEs -distal (below the knee)-- appears to be age indeterminate  -heparin gtt--->  transition to eliquis and check CBC in AM (heme test stools as well) -will need close monitoring of h/h by PCP as an outpatient.    Cardiomegaly: Patient reported having a chest x-ray which showed cardiomegaly outpatient setting by his primary care provider.  He had been sent to Dr. Bjorn Pippin of cardiology who had wanted to obtain an echocardiogram, Doppler ultrasound of the lower extremities, and obtain a myocardial perfusion study. -echocardiogram: Left ventricular ejection fraction, by visual estimation, is 55 to 60%. The left ventricle has normal function. Left ventricular septal wall thickness was mildly increased. Mildly increased left ventricular posterior wall thickness. There is mildly increased left ventricular hypertrophy.  GERD -Continue PPI  BPH -Continue Flomax  Hyperlipidemia: Home medications include simvastatin. -Continue Zocor  SVT/Afib with alternating bradycardia. -symptomatic with this.  Per cardiology:  -CHADSVASC score is at least 2 for age >48, however, given the brevity of his afib and recent profound anemia, would not recommend anticoagulation for this now.  -30-day outpatient monitor and follow-up with Dr. Bjorn Pippin afterwards.  - Given his bradycardia, would not recommend AVN blocker at this point.  Family Communication/Anticipated D/C date and plan/Code Status   DVT prophylaxis: Code Status: Full Code.  Family Communication:  Disposition Plan: heparin gtt to eliquis   Medical Consultants:    Gi.  cards   Subjective:    No BM yet Has not been out of bed walking  Objective:    Vitals:   03/23/19 0348 03/23/19 8119  03/23/19 1311 03/23/19 1354  BP: 127/64 121/82 117/72 (!) 122/103  Pulse: (!) 49 68 60 85  Resp: 20 18 18 18   Temp: 98.6 F (37 C) 98.4 F (36.9 C) 97.9 F (36.6 C) 98 F (36.7 C)  TempSrc:  Oral Oral Oral  SpO2: 96% 96% 97% 98%  Weight:      Height:        Intake/Output Summary (Last 24 hours) at 03/23/2019 1444 Last  data filed at 03/23/2019 1441 Gross per 24 hour  Intake 1271.77 ml  Output 600 ml  Net 671.77 ml   Filed Weights   03/19/19 0107 03/21/19 0725  Weight: 89.7 kg 89.7 kg    Exam: In bed, NAD rrr No increased work of breathing No LE edema  Data Reviewed:   I have personally reviewed following labs and imaging studies:  Labs: Labs show the following:   Basic Metabolic Panel: Recent Labs  Lab 03/18/19 0656 03/18/19 0656 03/19/19 1040 03/19/19 1040 03/20/19 0250 03/20/19 0250 03/21/19 0259 03/22/19 0101  NA 139  --  139  --  142  --  140 141  K 4.2   < > 3.6   < > 3.8   < > 3.7 3.7  CL 108  --  106  --  110  --  107 108  CO2 21*  --  24  --  24  --  26 27  GLUCOSE 111*  --  80  --  97  --  99 124*  BUN 21  --  10  --  7*  --  <5* 9  CREATININE 1.18  --  0.99  --  1.06  --  0.89 0.92  CALCIUM 9.1  --  8.9  --  8.8*  --  9.1 8.7*   < > = values in this interval not displayed.   GFR Estimated Creatinine Clearance: 79.4 mL/min (by C-G formula based on SCr of 0.92 mg/dL). Liver Function Tests: Recent Labs  Lab 03/17/19 1538 03/18/19 0656  AST 11 16  ALT 10 15  ALKPHOS 78 66  BILITOT 0.4 0.8  PROT 6.4 7.1  ALBUMIN 4.5 4.4   No results for input(s): LIPASE, AMYLASE in the last 168 hours. No results for input(s): AMMONIA in the last 168 hours. Coagulation profile Recent Labs  Lab 03/18/19 1724  INR 1.1    CBC: Recent Labs  Lab 03/18/19 0839 03/18/19 1728 03/19/19 1040 03/19/19 1040 03/20/19 0250 03/21/19 0259 03/22/19 0101 03/22/19 1405 03/23/19 0444  WBC 3.5*  --  4.3  --  4.9 4.8 6.1  --  4.3  NEUTROABS 2.0  --   --   --   --   --   --   --   --   HGB 4.3*   < > 8.6*   < > 9.3* 9.7* 8.7* 8.6* 8.8*  HCT 17.4*   < > 28.6*   < > 31.7* 33.2* 31.0* 30.5* 30.8*  MCV 69.9*  --  74.9*  --  77.5* 76.9* 80.3  --  81.7  PLT 331  --  303  --  319 267 277  --  236   < > = values in this interval not displayed.   Cardiac Enzymes: No results for input(s):  CKTOTAL, CKMB, CKMBINDEX, TROPONINI in the last 168 hours. BNP (last 3 results) Recent Labs    03/17/19 1538  PROBNP 614*   CBG: No results for input(s): GLUCAP in the last 168 hours. D-Dimer:  No results for input(s): DDIMER in the last 72 hours. Hgb A1c: No results for input(s): HGBA1C in the last 72 hours. Lipid Profile: No results for input(s): CHOL, HDL, LDLCALC, TRIG, CHOLHDL, LDLDIRECT in the last 72 hours. Thyroid function studies: No results for input(s): TSH, T4TOTAL, T3FREE, THYROIDAB in the last 72 hours.  Invalid input(s): FREET3 Anemia work up: No results for input(s): VITAMINB12, FOLATE, FERRITIN, TIBC, IRON, RETICCTPCT in the last 72 hours. Sepsis Labs: Recent Labs  Lab 03/20/19 0250 03/21/19 0259 03/22/19 0101 03/23/19 0444  WBC 4.9 4.8 6.1 4.3    Microbiology Recent Results (from the past 240 hour(s))  SARS CORONAVIRUS 2 (TAT 6-24 HRS) Nasopharyngeal Nasopharyngeal Swab     Status: None   Collection Time: 03/18/19  8:39 AM   Specimen: Nasopharyngeal Swab  Result Value Ref Range Status   SARS Coronavirus 2 NEGATIVE NEGATIVE Final    Comment: (NOTE) SARS-CoV-2 target nucleic acids are NOT DETECTED. The SARS-CoV-2 RNA is generally detectable in upper and lower respiratory specimens during the acute phase of infection. Negative results do not preclude SARS-CoV-2 infection, do not rule out co-infections with other pathogens, and should not be used as the sole basis for treatment or other patient management decisions. Negative results must be combined with clinical observations, patient history, and epidemiological information. The expected result is Negative. Fact Sheet for Patients: SugarRoll.be Fact Sheet for Healthcare Providers: https://www.woods-mathews.com/ This test is not yet approved or cleared by the [place] FDA and  has been authorized for detection and/or diagnosis of SARS-CoV-2 by FDA under an  Emergency Use Authorization (EUA). This EUA will remain  in effect (meaning this test can be used) for the duration of the COVID-19 declaration under Section 56 4(b)(1) of the Act, 21 U.S.C. section 360bbb-3(b)(1), unless the authorization is terminated or revoked sooner. Performed at [facility], [address], [place], [place] [address]     Procedures and diagnostic studies:  No results found.  Medications:   . sodium chloride   Intravenous Once  . famotidine  20 mg Oral BID  . ferrous sulfate  325 mg Oral BID WC  . pantoprazole  40 mg Oral Daily  . simvastatin  20 mg Oral QHS  . sodium chloride flush  3 mL Intravenous Q12H  . tamsulosin  0.4 mg Oral QHS  . vitamin B-12  1,000 mcg Oral Daily   Continuous Infusions: . heparin 1,250 Units/hr (03/23/19 0744)     LOS: 4 days   Geradine Girt  [facility]   How to contact the [facility] Attending or Consulting provider [place] or covering provider during after hours [place], for this patient?  1. Check the care team in [facility] and look for a) attending/consulting [facility] provider listed and b) the [facility] team listed 2. Log into [email or web address] and use [place]'s universal password to access. If you do not have the password, please contact the hospital operator. 3. Locate the [facility] provider you are looking for under [facility] and page to a number that you can be directly reached. 4. If you still have difficulty reaching the provider, please page the [facility] (Director on Call) for the Hospitalists listed on amion for assistance.  03/23/2019, 2:44 PM

## 2019-03-24 ENCOUNTER — Other Ambulatory Visit: Payer: Self-pay | Admitting: Physician Assistant

## 2019-03-24 LAB — SURGICAL PATHOLOGY

## 2019-03-24 LAB — CBC
HCT: 33.7 % — ABNORMAL LOW (ref 39.0–52.0)
Hemoglobin: 9.5 g/dL — ABNORMAL LOW (ref 13.0–17.0)
MCH: 23.6 pg — ABNORMAL LOW (ref 26.0–34.0)
MCHC: 28.2 g/dL — ABNORMAL LOW (ref 30.0–36.0)
MCV: 83.8 fL (ref 80.0–100.0)
Platelets: 237 10*3/uL (ref 150–400)
RBC: 4.02 MIL/uL — ABNORMAL LOW (ref 4.22–5.81)
RDW: 27.3 % — ABNORMAL HIGH (ref 11.5–15.5)
WBC: 4.8 10*3/uL (ref 4.0–10.5)
nRBC: 0 % (ref 0.0–0.2)

## 2019-03-24 MED ORDER — CYANOCOBALAMIN 1000 MCG PO TABS: 1000.0000 ug | ORAL_TABLET | Freq: Every day | ORAL | 0 refills | Status: AC

## 2019-03-24 MED ORDER — PANTOPRAZOLE SODIUM 40 MG PO TBEC: 40.0000 mg | DELAYED_RELEASE_TABLET | Freq: Every day | ORAL | 0 refills | Status: AC

## 2019-03-24 MED ORDER — FERROUS SULFATE 325 (65 FE) MG PO TABS: 325.0000 mg | ORAL_TABLET | Freq: Two times a day (BID) | ORAL | 0 refills | Status: AC

## 2019-03-24 MED FILL — VITAMIN B-12 1000 MCG TABS: 1000 | 30 days supply | Qty: 30 | Fill #0

## 2019-03-24 MED FILL — FERROUS SULFATE 325 MG TAB: 325 (65 FE) | 30 days supply | Qty: 60 | Fill #0

## 2019-03-24 MED FILL — ELIQUIS STARTER PACK 5 MG T: 5 | 30 days supply | Qty: 74 | Fill #0

## 2019-03-24 MED FILL — PANTOPRAZOLE SOD DR 40 MG T: 40 | 30 days supply | Qty: 30 | Fill #0

## 2019-03-24 NOTE — Discharge Summary (Addendum)
Physician Discharge Summary  Dontrey Snellgrove KHT:[id number] DOB: 1940-07-23 DOA: 03/18/2019  PCP: Johny Blamer, MD  Admit date: 03/18/2019 Discharge date: 03/24/2019  Admitted From: Home Discharge disposition: Home   Recommendations for Outpatient Follow-Up:   1. CBC and iron in 1 week, monitor B12 2. If patient continues to have issues with a drop in hemoglobin or iron, may need to come off of the Eliquis for his DVT/PE 3. Daily PPI 4. Daily iron replacement with bowel regimen 5. Cardiology to arrange an event monitor   Discharge Diagnosis:   Principal Problem:   Iron deficiency anemia due to chronic blood loss Active Problems:   Cardiomegaly   GERD (gastroesophageal reflux disease)   BPH (benign prostatic hyperplasia)   Hyperlipidemia   Severe anemia    Discharge Condition: Improved.  Diet recommendation:  Regular.  Wound care: None.  Code status: Full.   History of Present Illness:   Joshua Estrada is a 79 y.o. male with medical history significant of hyperlipidemia; who presents after being told that he had an low blood counts.  Patient had followed up with cardiology after his primary care provider had obtained a chest x-ray which noted cardiomegaly.  Over the last 2 months he had been experiencing progressive exertional dyspnea.  Complains of being unable to walk or do anything without needing to rest.  Associated symptoms include lower extremity swelling.  He has not had any dark or bloody stools to his knowledge.  Patient takes a daily aspirin when he remember, but is not on any other anticoagulation.  Denies use of any NSAIDs, abdominal pain, weight loss to his knowledge, nausea, vomiting, or diarrhea.  Patient notes that he has had a stool test done several years ago, but has never had a colostomy.   Hospital Course by Problem:   Iron deficiency anemiadueto chronic blood loss, vitamin B12 deficiency:Acute.Patient presents withhemoglobin 4.8 withlow  MCV and MCH.  -Patient uses aspirin, but not on any other blood thinners. Stool guaiacs were noted to be negative. Anemia panel revealed iron 6, TIBC 543, ferritin 3, and vitamin B12 128.  - 3-5 units of packed red blood cells- documentation not clear -Feraheme transfusion x 1 and again on 1/17 -IM b12 x 2 doses and thenVitamin B12 1000 mcg daily -s/p colonoscopy and EGD: Patient may have chronic blood loss related to the large hiatal hernia and will benefit from iron supplementation and PPI use.  Incidental left leg DVT but CTA positive for multiple PEs -distal (below the knee)-- appears to be age indeterminate  -heparin gtt---> transition to eliquis -- no drop in his hgb -will need close monitoring of h/h and iron by PCP as an outpatient.    Cardiomegaly: Patient reported having a chest x-ray which showed cardiomegaly outpatient setting by his primary care provider. He had been sent to Dr. Bjorn Pippin ofcardiologywho had wanted to obtain an echocardiogram, Doppler ultrasound of the lower extremities, and obtain a myocardial perfusion study. -echocardiogram: Left ventricular ejection fraction, by visual estimation, is 55 to 60%. The left ventricle has normal function. Left ventricular septal wall thickness was mildly increased. Mildly increased left ventricular posterior wall thickness. There is mildly increased left ventricular hypertrophy.  GERD -Continue PPI  BPH -Continue Flomax  Hyperlipidemia: Home medications include simvastatin. -Continue Zocor  SVT/Afib with alternating bradycardia. asymptomatic Per cardiology:  -CHADSVASC score is at least 2 for age >34, however, given the brevity of his afib and recent profound anemia, would not recommend anticoagulation for  this now.  -30-day outpatient monitor and follow-up with Dr. Bjorn Pippin afterwards.  - Given his bradycardia, would not recommend AVN blocker at this point.    Medical Consultants:     GI cards  Discharge Exam:   Vitals:   03/24/19 0330 03/24/19 0835  BP: (!) 143/79 136/65  Pulse: 66 64  Resp: 16 16  Temp: (!) 97.4 F (36.3 C) 98 F (36.7 C)  SpO2: 96% 96%   Vitals:   03/23/19 1354 03/23/19 2005 03/24/19 0330 03/24/19 0835  BP: (!) 122/103 112/64 (!) 143/79 136/65  Pulse: 85 (!) 51 66 64  Resp: 18 18 16 16   Temp: 98 F (36.7 C) (!) 97.4 F (36.3 C) (!) 97.4 F (36.3 C) 98 F (36.7 C)  TempSrc: Oral Oral Oral Oral  SpO2: 98% 96% 96% 96%  Weight:      Height:        General exam: Appears calm and comfortable.     The results of significant diagnostics from this hospitalization (including imaging, microbiology, ancillary and laboratory) are listed below for reference.     Procedures and Diagnostic Studies:   ECHOCARDIOGRAM COMPLETE  Result Date: 03/19/2019   ECHOCARDIOGRAM REPORT   Patient Name:   [name] Date of Exam: 03/19/2019 Medical Rec #:  03/21/2019  Height:       74.0 in Accession #:    [id number] Weight:       197.8 lb Date of Birth:  1940-04-28  BSA:          2.16 m Patient Age:    78 years   BP:           121/60 mmHg Patient Gender: M          HR:           63 bpm. Exam Location:  Inpatient Procedure: 2D Echo, Cardiac Doppler and Color Doppler Indications:    I51.7 Cardiomegaly  History:        Patient has no prior history of Echocardiogram examinations.                 Cardiomegaly; Risk Factors:Dyslipidemia.  Sonographer:    06/26/1940 RDCS Referring Phys: Sheralyn Boatman [facility] A SMITH  Sonographer Comments: Suboptimal subcostal window. Spinal curvature. IMPRESSIONS  1. Left ventricular ejection fraction, by visual estimation, is 55 to 60%. The left ventricle has normal function. Left ventricular septal wall thickness was mildly increased. Mildly increased left ventricular posterior wall thickness. There is mildly increased left ventricular hypertrophy.  2. Left ventricular diastolic parameters are indeterminate.  3. The left ventricle has no  regional wall motion abnormalities.  4. Global right ventricle has normal systolic function.The right ventricular size is normal. No increase in right ventricular wall thickness.  5. Left atrial size was severely dilated.  6. Right atrial size was moderately dilated.  7. The mitral valve is normal in structure. Trivial mitral valve regurgitation. No evidence of mitral stenosis.  8. The tricuspid valve is normal in structure.  9. The aortic valve is normal in structure. Aortic valve regurgitation is not visualized. No evidence of aortic valve sclerosis or stenosis. 10. Pulmonic regurgitation is mild. 11. The pulmonic valve was normal in structure. Pulmonic valve regurgitation is mild. 12. Normal pulmonary artery systolic pressure. 13. The inferior vena cava is normal in size with greater than 50% respiratory variability, suggesting right atrial pressure of 3 mmHg. FINDINGS  Left Ventricle: Left ventricular ejection fraction, by visual estimation, is 55 to 60%. The  left ventricle has normal function. The left ventricle has no regional wall motion abnormalities. Mildly increased left ventricular posterior wall thickness. There is mildly increased left ventricular hypertrophy. Left ventricular diastolic parameters are indeterminate. Normal left atrial pressure. Right Ventricle: The right ventricular size is normal. No increase in right ventricular wall thickness. Global RV systolic function is has normal systolic function. The tricuspid regurgitant velocity is 1.42 m/s, and with an assumed right atrial pressure  of 3 mmHg, the estimated right ventricular systolic pressure is normal at 11.1 mmHg. Left Atrium: Left atrial size was severely dilated. Right Atrium: Right atrial size was moderately dilated Pericardium: There is no evidence of pericardial effusion. Mitral Valve: The mitral valve is normal in structure. Trivial mitral valve regurgitation. No evidence of mitral valve stenosis by observation. Tricuspid Valve: The  tricuspid valve is normal in structure. Tricuspid valve regurgitation is mild. Aortic Valve: The aortic valve is normal in structure. Aortic valve regurgitation is not visualized. The aortic valve is structurally normal, with no evidence of sclerosis or stenosis. Pulmonic Valve: The pulmonic valve was normal in structure. Pulmonic valve regurgitation is mild. Pulmonic regurgitation is mild. Aorta: The aortic root, ascending aorta and aortic arch are all structurally normal, with no evidence of dilitation or obstruction. Venous: The inferior vena cava is normal in size with greater than 50% respiratory variability, suggesting right atrial pressure of 3 mmHg. IAS/Shunts: No atrial level shunt detected by color flow Doppler. There is no evidence of a patent foramen ovale. No ventricular septal defect is seen or detected. There is no evidence of an atrial septal defect.  LEFT VENTRICLE PLAX 2D LVIDd:         4.52 cm LVIDs:         3.17 cm LV PW:         1.27 cm LV IVS:        1.19 cm LVOT diam:     2.20 cm LV SV:         53 ml LV SV Index:   24.59 LVOT Area:     3.80 cm  LV Volumes (MOD) LV area d, A2C:    32.60 cm LV area d, A4C:    31.90 cm LV area s, A2C:    20.40 cm LV area s, A4C:    18.10 cm LV major d, A2C:   7.79 cm LV major d, A4C:   7.77 cm LV major s, A2C:   6.71 cm LV major s, A4C:   6.04 cm LV vol d, MOD A2C: 108.0 ml LV vol d, MOD A4C: 107.0 ml LV vol s, MOD A2C: 51.3 ml LV vol s, MOD A4C: 46.3 ml LV SV MOD A2C:     56.7 ml LV SV MOD A4C:     107.0 ml LV SV MOD BP:      56.1 ml RIGHT VENTRICLE         IVC TAPSE (M-mode): 2.6 cm  IVC diam: 1.81 cm LEFT ATRIUM             Index       RIGHT ATRIUM           Index LA diam:        4.90 cm 2.27 cm/m  RA Area:     25.00 cm LA Vol (A2C):   67.6 ml 31.25 ml/m RA Volume:   78.30 ml  36.20 ml/m LA Vol (A4C):   99.0 ml 45.77 ml/m LA Biplane Vol: 84.3 ml 38.97 ml/m  AORTIC VALVE LVOT Vmax:   133.00 cm/s LVOT Vmean:  79.400 cm/s LVOT VTI:    0.232 m  AORTA Ao  Root diam: 3.60 cm Ao Asc diam:  3.60 cm MITRAL VALVE                        TRICUSPID VALVE MV Area (PHT): 3.72 cm             TR Peak grad:   8.1 mmHg MV PHT:        59.16 msec           TR Vmax:        142.00 cm/s MV Decel Time: 204 msec MV E velocity: 87.80 cm/s 103 cm/s  SHUNTS MV A velocity: 62.10 cm/s 70.3 cm/s Systemic VTI:  0.23 m MV E/A ratio:  1.41       1.5       Systemic Diam: 2.20 cm  Chilton Si MD Electronically signed by Chilton Si MD Signature Date/Time: 03/19/2019/2:39:57 PM    Final    VAS [place] LOWER EXTREMITY VENOUS (DVT)  Result Date: 03/19/2019  Lower Venous Study Indications: Edema.  Risk Factors: None identified. Comparison Study: No prior studies. Performing Technologist: Chanda Busing RVT  Examination Guidelines: A complete evaluation includes B-mode imaging, spectral Doppler, color Doppler, and power Doppler as needed of all accessible portions of each vessel. Bilateral testing is considered an integral part of a complete examination. Limited examinations for reoccurring indications may be performed as noted.  +---------+---------------+---------+-----------+----------+--------------+ RIGHT    CompressibilityPhasicitySpontaneityPropertiesThrombus Aging +---------+---------------+---------+-----------+----------+--------------+ CFV      Full           Yes      Yes                                 +---------+---------------+---------+-----------+----------+--------------+ SFJ      Full                                                        +---------+---------------+---------+-----------+----------+--------------+ FV Prox  Full                                                        +---------+---------------+---------+-----------+----------+--------------+ FV Mid   Full                                                        +---------+---------------+---------+-----------+----------+--------------+ FV DistalFull                                                         +---------+---------------+---------+-----------+----------+--------------+ PFV      Full                                                        +---------+---------------+---------+-----------+----------+--------------+  POP      Full           Yes      Yes                                 +---------+---------------+---------+-----------+----------+--------------+ PTV      Full                                                        +---------+---------------+---------+-----------+----------+--------------+ PERO     Full                                                        +---------+---------------+---------+-----------+----------+--------------+   +---------+---------------+---------+-----------+----------+-----------------+ LEFT     CompressibilityPhasicitySpontaneityPropertiesThrombus Aging    +---------+---------------+---------+-----------+----------+-----------------+ CFV      Full           Yes      Yes                                    +---------+---------------+---------+-----------+----------+-----------------+ SFJ      Full                                                           +---------+---------------+---------+-----------+----------+-----------------+ FV Prox  Full                                                           +---------+---------------+---------+-----------+----------+-----------------+ FV Mid   Full                                                           +---------+---------------+---------+-----------+----------+-----------------+ FV DistalFull                                                           +---------+---------------+---------+-----------+----------+-----------------+ PFV      Full                                                           +---------+---------------+---------+-----------+----------+-----------------+ POP      Full           Yes      Yes                                     +---------+---------------+---------+-----------+----------+-----------------+  PTV      Full                                                           +---------+---------------+---------+-----------+----------+-----------------+ PERO     None                                         Age Indeterminate +---------+---------------+---------+-----------+----------+-----------------+     Summary: Right: There is no evidence of deep vein thrombosis in the lower extremity. No cystic structure found in the popliteal fossa. 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 No cystic structure found in the popliteal fossa.  *See table(s) above for measurements and observations. Electronically signed by Lemar Livings MD on 03/19/2019 at 3:31:17 PM.    Final      Labs:   Basic Metabolic Panel: Recent Labs  Lab 03/18/19 0656 03/18/19 0656 03/19/19 1040 03/19/19 1040 03/20/19 0250 03/20/19 0250 03/21/19 0259 03/22/19 0101  NA 139  --  139  --  142  --  140 141  K 4.2   < > 3.6   < > 3.8   < > 3.7 3.7  CL 108  --  106  --  110  --  107 108  CO2 21*  --  24  --  24  --  26 27  GLUCOSE 111*  --  80  --  97  --  99 124*  BUN 21  --  10  --  7*  --  <5* 9  CREATININE 1.18  --  0.99  --  1.06  --  0.89 0.92  CALCIUM 9.1  --  8.9  --  8.8*  --  9.1 8.7*   < > = values in this interval not displayed.   GFR Estimated Creatinine Clearance: 79.4 mL/min (by C-G formula based on SCr of 0.92 mg/dL). Liver Function Tests: Recent Labs  Lab 03/17/19 1538 03/18/19 0656  AST 11 16  ALT 10 15  ALKPHOS 78 66  BILITOT 0.4 0.8  PROT 6.4 7.1  ALBUMIN 4.5 4.4   No results for input(s): LIPASE, AMYLASE in the last 168 hours. No results for input(s): AMMONIA in the last 168 hours. Coagulation profile Recent Labs  Lab 03/18/19 1724  INR 1.1    CBC: Recent Labs  Lab 03/18/19 0839 03/18/19 1728 03/20/19 0250  03/20/19 0250 03/21/19 0259 03/22/19 0101 03/22/19 1405 03/23/19 0444 03/24/19 0754  WBC 3.5*   < > 4.9  --  4.8 6.1  --  4.3 4.8  NEUTROABS 2.0  --   --   --   --   --   --   --   --   HGB 4.3*   < > 9.3*   < > 9.7* 8.7* 8.6* 8.8* 9.5*  HCT 17.4*   < > 31.7*   < > 33.2* 31.0* 30.5* 30.8* 33.7*  MCV 69.9*   < > 77.5*  --  76.9* 80.3  --  81.7 83.8  PLT 331   < > 319  --  267 277  --  236 237   < > = values in this interval not displayed.  Cardiac Enzymes: No results for input(s): CKTOTAL, CKMB, CKMBINDEX, TROPONINI in the last 168 hours. BNP: Invalid input(s): POCBNP CBG: No results for input(s): GLUCAP in the last 168 hours. D-Dimer No results for input(s): DDIMER in the last 72 hours. Hgb A1c No results for input(s): HGBA1C in the last 72 hours. Lipid Profile No results for input(s): CHOL, HDL, LDLCALC, TRIG, CHOLHDL, LDLDIRECT in the last 72 hours. Thyroid function studies No results for input(s): TSH, T4TOTAL, T3FREE, THYROIDAB in the last 72 hours.  Invalid input(s): FREET3 Anemia work up No results for input(s): VITAMINB12, FOLATE, FERRITIN, TIBC, IRON, RETICCTPCT in the last 72 hours. Microbiology Recent Results (from the past 240 hour(s))  SARS CORONAVIRUS 2 (TAT 6-24 HRS) Nasopharyngeal Nasopharyngeal Swab     Status: None   Collection Time: 03/18/19  8:39 AM   Specimen: Nasopharyngeal Swab  Result Value Ref Range Status   SARS Coronavirus 2 NEGATIVE NEGATIVE Final    Comment: (NOTE) SARS-CoV-2 target nucleic acids are NOT DETECTED. The SARS-CoV-2 RNA is generally detectable in upper and lower respiratory specimens during the acute phase of infection. Negative results do not preclude SARS-CoV-2 infection, do not rule out co-infections with other pathogens, and should not be used as the sole basis for treatment or other patient management decisions. Negative results must be combined with clinical observations, patient history, and epidemiological information.  The expected result is Negative. Fact Sheet for Patients: HairSlick.nohttps://www.fda.gov/media/138098/download Fact Sheet for Healthcare Providers: quierodirigir.comhttps://www.fda.gov/media/138095/download This test is not yet approved or cleared by the [place] FDA and  has been authorized for detection and/or diagnosis of SARS-CoV-2 by FDA under an Emergency Use Authorization (EUA). This EUA will remain  in effect (meaning this test can be used) for the duration of the COVID-19 declaration under Section 56 4(b)(1) of the Act, 21 U.S.C. section 360bbb-3(b)(1), unless the authorization is terminated or revoked sooner. Performed at [facility], [address] [place], [place] [address]      Discharge Instructions:   Discharge Instructions    Diet general   Complete by: As directed    Discharge instructions   Complete by: As directed    Will need close monitoring of iron levels as well as h/h Iron can be constipating so be sure to use stool softeners as needed   Increase activity slowly   Complete by: As directed      Allergies as of 03/24/2019   No Known Allergies     Medication List    STOP taking these medications   aspirin EC 81 MG tablet   famotidine 40 MG tablet Commonly known as: PEPCID     TAKE these medications   Apixaban Starter Pack 5 MG Tbpk Commonly known as: ELIQUIS STARTER PACK Take as directed on package: start with two-5mg  tablets twice daily for 7 days. On day 8, switch to one-5mg  tablet twice daily.   cyanocobalamin 1000 MCG tablet Take 1 tablet (1,000 mcg total) by mouth daily.   ferrous sulfate 325 (65 FE) MG tablet Take 1 tablet (325 mg total) by mouth 2 (two) times daily with a meal.   pantoprazole 40 MG tablet Commonly known as: PROTONIX Take 1 tablet (40 mg total) by mouth daily.   PRESERVISION AREDS 2 PO Take 1 tablet by mouth 2 (two) times daily.   simvastatin 20 MG tablet Commonly known as: ZOCOR Take 20 mg by mouth at bedtime.    tamsulosin 0.4 MG Caps capsule Commonly known as: FLOMAX Take 0.4 mg by mouth  at bedtime.      Follow-up Information    [place] [facility] Follow up.   Specialty: Cardiology Why: the office will call you Monday to schedule appt for event monitor to wear.  if you have not heard by tuesday then call the office  we will arrange Outpt appt with Dr. Gardiner Rhyme for follow up   Contact information: [address], [place]       Shirline Frees, MD Follow up in 1 week(s).   Specialty: Family Medicine Why: cbc and iron level Contact information: [place] [address] [phone]        Donato Heinz, MD .   Specialty: Cardiology Contact information: [address] [place] [address] [phone]            Time coordinating discharge: 45 min  Signed:  Geradine Girt DO  [facility] 03/24/2019, 12:19 PM

## 2019-03-24 NOTE — Care Management Important Message (Signed)
Important Message  Patient Details  Name: Joshua Estrada MRN: [id number] Date of Birth: [birth year]   Medicare Important Message Given:  Yes     Mardene Sayer 03/24/2019, 1:47 PM

## 2019-03-24 NOTE — Progress Notes (Signed)
Discharge paperwork and instructions given to pt. Pt not in distress and tolerated well. 

## 2019-03-24 NOTE — Discharge Instructions (Signed)
Iron Deficiency Anemia, Adult Iron-deficiency anemia is when you have a low amount of red blood cells or hemoglobin. This happens because you have too little iron in your body. Hemoglobin carries oxygen to parts of the body. Anemia can cause your body to not get enough oxygen. It may or may not cause symptoms. Follow these instructions at home: Medicines  Take over-the-counter and prescription medicines only as told by your doctor. This includes iron pills (supplements) and vitamins.  If you cannot handle taking iron pills by mouth, ask your doctor about getting iron through: ? A vein (intravenously). ? A shot (injection) into a muscle.  Take iron pills when your stomach is empty. If you cannot handle this, take them with food.  Do not drink milk or take antacids at the same time as your iron pills.  To prevent trouble pooping (constipation), eat fiber or take medicine (stool softener) as told by your doctor. Eating and drinking   Talk with your doctor before changing the foods you eat. He or she may tell you to eat foods that have a lot of iron, such as: ? Liver. ? Lowfat (lean) beef. ? Breads and cereals that have iron added to them (fortified breads and cereals). ? Eggs. ? Dried fruit. ? Dark green, leafy vegetables.  Drink enough fluid to keep your pee (urine) clear or pale yellow.  Eat fresh fruits and vegetables that are high in vitamin C. They help your body to use iron. Foods with a lot of vitamin C include: ? Oranges. ? Peppers. ? Tomatoes. ? Mangoes. General instructions  Return to your normal activities as told by your doctor. Ask your doctor what activities are safe for you.  Keep yourself clean, and keep things clean around you (your surroundings). Anemia can make you get sick more easily.  Keep all follow-up visits as told by your doctor. This is important. Contact a doctor if:  You feel sick to your stomach (nauseous).  You throw up (vomit).  You feel  weak.  You are sweating for no clear reason.  You have trouble pooping, such as: ? Pooping (having a bowel movement) less than 3 times a week. ? Straining to poop. ? Having poop that is hard, dry, or larger than normal. ? Feeling full or bloated. ? Pain in the lower belly. ? Not feeling better after pooping. Get help right away if:  You pass out (faint). If this happens, do not drive yourself to the hospital. Call your local emergency services (911 in the [place]  You have chest pain.  You have shortness of breath that: ? Is very bad. ? Gets worse with physical activity.  You have a fast heartbeat.  You get light-headed when getting up from sitting or lying down. This information is not intended to replace advice given to you by your health care provider. Make sure you discuss any questions you have with your health care provider. Document Revised: 02/01/2017 Document Reviewed: 11/09/2015 Elsevier Patient Education  2020 [place] on my medicine - ELIQUIS (apixaban)  This medication education was reviewed with me or my healthcare representative as part of my discharge preparation.  The pharmacist that spoke with me during my hospital stay was:  Yujing Z  Steenwyk,, RPH  Why was Eliquis prescribed for you? Eliquis was prescribed to treat blood clots that may have been found in the veins of your legs (deep vein thrombosis) or in your lungs (pulmonary embolism) and to reduce the risk  of them occurring again.  What do You need to know about Eliquis ? The starting dose is 10 mg (two 5 mg tablets) taken TWICE daily for the FIRST SEVEN (7) DAYS, then on (enter date)  Mar 30, 2019  the dose is reduced to ONE 5 mg tablet taken TWICE daily.  Eliquis may be taken with or without food.   Try to take the dose about the same time in the morning and in the evening. If you have difficulty swallowing the tablet whole please discuss with your pharmacist how to take the  medication safely.  Take Eliquis exactly as prescribed and DO NOT stop taking Eliquis without talking to the doctor who prescribed the medication.  Stopping may increase your risk of developing a new blood clot.  Refill your prescription before you run out.  After discharge, you should have regular check-up appointments with your healthcare provider that is prescribing your Eliquis.    What do you do if you miss a dose? If a dose of ELIQUIS is not taken at the scheduled time, take it as soon as possible on the same day and twice-daily administration should be resumed. The dose should not be doubled to make up for a missed dose.  Important Safety Information A possible side effect of Eliquis is bleeding. You should call your healthcare provider right away if you experience any of the following: ? Bleeding from an injury or your nose that does not stop. ? Unusual colored urine (red or dark brown) or unusual colored stools (red or black). ? Unusual bruising for unknown reasons. ? A serious fall or if you hit your head (even if there is no bleeding).  Some medicines may interact with Eliquis and might increase your risk of bleeding or clotting while on Eliquis. To help avoid this, consult your healthcare provider or pharmacist prior to using any new prescription or non-prescription medications, including herbals, vitamins, non-steroidal anti-inflammatory drugs (NSAIDs) and supplements.  This website has more information on Eliquis (apixaban): http://www.eliquis.com/eliquis/home

## 2019-03-24 NOTE — Plan of Care (Signed)
  Problem: Nutrition: Goal: Adequate nutrition will be maintained Outcome: Progressing   

## 2019-03-25 ENCOUNTER — Telehealth: Payer: Self-pay | Admitting: Internal Medicine

## 2019-03-25 NOTE — Telephone Encounter (Signed)
appt scheduled for 05/04/19 with Dr. Rennis Golden.

## 2019-03-25 NOTE — Telephone Encounter (Signed)
-----   Message from Lindell Spar, RN sent at 03/23/2019 10:59 AM EST ----- Regarding: FW: Needs new lipid appt Please schedule new lipid consult for 30 minutes (virtual is OK). Thanks  ----- Message ----- From: Chrystie Nose, MD Sent: 03/21/2019  11:45 AM EST To: Lindell Spar, RN Subject: Needs new lipid appt                           Seen in the hospital for stroke - statin intolerant, needs new lipid appt - can be virtual.  Thanks,  Dr Rexene Edison

## 2019-03-26 ENCOUNTER — Other Ambulatory Visit (HOSPITAL_COMMUNITY): Payer: Federal, State, Local not specified - PPO

## 2019-03-28 ENCOUNTER — Ambulatory Visit (INDEPENDENT_AMBULATORY_CARE_PROVIDER_SITE_OTHER): Payer: Federal, State, Local not specified - PPO

## 2019-03-28 DIAGNOSIS — I471 Supraventricular tachycardia: Secondary | ICD-10-CM

## 2019-04-03 DIAGNOSIS — D508 Other iron deficiency anemias: Secondary | ICD-10-CM | POA: Diagnosis not present

## 2019-04-03 DIAGNOSIS — E7800 Pure hypercholesterolemia, unspecified: Secondary | ICD-10-CM | POA: Diagnosis not present

## 2019-05-04 ENCOUNTER — Ambulatory Visit: Payer: Federal, State, Local not specified - PPO | Admitting: Internal Medicine

## 2019-05-04 ENCOUNTER — Encounter: Payer: Self-pay | Admitting: Internal Medicine

## 2019-05-04 ENCOUNTER — Other Ambulatory Visit: Payer: Self-pay

## 2019-05-04 VITALS — BP 131/83 | HR 61 | Temp 98.0°F | Ht 74.0 in | Wt 201.6 lb

## 2019-05-04 DIAGNOSIS — R072 Precordial pain: Secondary | ICD-10-CM

## 2019-05-04 DIAGNOSIS — I480 Paroxysmal atrial fibrillation: Secondary | ICD-10-CM | POA: Diagnosis not present

## 2019-05-04 DIAGNOSIS — I82409 Acute embolism and thrombosis of unspecified deep veins of unspecified lower extremity: Secondary | ICD-10-CM | POA: Diagnosis not present

## 2019-05-04 NOTE — Telephone Encounter (Signed)
Erroneous encounter - patient tolerates statins, no history of stroke. Documented on the wrong patient.  Chrystie Nose, MD, [facility], FACP  [place]  [facility]  Medical Director of the Advanced Lipid Disorders &  Cardiovascular Risk Reduction Clinic Diplomate of the American Board of Clinical Lipidology Attending Cardiologist  Direct Dial: [phone]  Fax: [phone]  Website:  [email or web address]

## 2019-05-04 NOTE — Progress Notes (Signed)
OFFICE NOTE  Chief Complaint:  Follow-up hospitalization  Primary Care Physician: Johny Blamer, MD  HPI:  Joshua Estrada is a 79 y.o. male with a past medial history significant for shortness of breath, chest pain and cardiomegaly, found to have Hgb of 4.2 - sent to the ER. Echo done here shows LVEF 55-60%, severe LAE and moderate RAE, low RA pressure.  He was recently hospitalized for this and found to have a large hiatal hernia.  This required transfusion.  Is felt that this is possibly longstanding anemia related to his large hiatal hernia.  A GI work-up including EGD and colonoscopy was performed.  He is now on iron therapy.  While hospitalized he was noted to have atrial fibrillation with RVR.  At the time due to his anemia, it was recommended that he discontinue anticoagulation.  He was however found to have age-indeterminate DVT for which he was anticoagulated subsequently.  He is currently on Eliquis 5 mg twice daily.  I had recommended an outpatient monitor to see if he was having more persistent atrial fibrillation.  The formal monitor result is not yet available, however he was noted on my review of the rhythm strips today to have several episodes of atrial fibrillation which is paroxysmal.  I discussed the case with Dr. Bjorn Pippin, who is his primary cardiologist and Mr. Oscar will follow up with him to discuss whether he needs to stay on Eliquis long-term.  PMHx:  Past Medical History:  Diagnosis Date  . BPH (benign prostatic hyperplasia)   . Hyperlipidemia     Past Surgical History:  Procedure Laterality Date  . BIOPSY  03/21/2019   Procedure: BIOPSY;  Surgeon: Kerin Salen, MD;  Location: [facility] ENDOSCOPY;  Service: Gastroenterology;;  . Cataract surgery    . COLONOSCOPY WITH PROPOFOL N/A 03/21/2019   Procedure: COLONOSCOPY WITH PROPOFOL;  Surgeon: Kerin Salen, MD;  Location: [facility] ENDOSCOPY;  Service: Gastroenterology;  Laterality: N/A;  . ESOPHAGOGASTRODUODENOSCOPY (EGD) WITH PROPOFOL  N/A 03/21/2019   Procedure: ESOPHAGOGASTRODUODENOSCOPY (EGD) WITH PROPOFOL;  Surgeon: Kerin Salen, MD;  Location: [facility] ENDOSCOPY;  Service: Gastroenterology;  Laterality: N/A;  . POLYPECTOMY  03/21/2019   Procedure: POLYPECTOMY;  Surgeon: Kerin Salen, MD;  Location: [facility] ENDOSCOPY;  Service: Gastroenterology;;  . TONSILLECTOMY     As a child    FAMHx:  Family History  Problem Relation Age of Onset  . Breast cancer Mother   . Congestive Heart Failure Father     SOCHx:   reports that he has never smoked. He has never used smokeless tobacco. He reports that he does not drink alcohol or use drugs.  ALLERGIES:  No Known Allergies  ROS: Pertinent items noted in HPI and remainder of comprehensive ROS otherwise negative.  HOME MEDS: Current Outpatient Medications on File Prior to Visit  Medication Sig Dispense Refill  . Apixaban Starter Pack (ELIQUIS STARTER PACK) 5 MG TBPK Take as directed on package: start with two-5mg  tablets twice daily for 7 days. On day 8, switch to one-5mg  tablet twice daily. 1 each 0  . ferrous sulfate 325 (65 FE) MG tablet Take 1 tablet (325 mg total) by mouth 2 (two) times daily with a meal. 60 tablet 0  . Multiple Vitamins-Minerals (PRESERVISION AREDS 2 PO) Take 1 tablet by mouth 2 (two) times daily.     . pantoprazole (PROTONIX) 40 MG tablet Take 1 tablet (40 mg total) by mouth daily. 30 tablet 0  . simvastatin (ZOCOR) 20 MG tablet Take 20 mg by mouth  at bedtime.    . tamsulosin (FLOMAX) 0.4 MG CAPS capsule Take 0.4 mg by mouth at bedtime.     . vitamin B-12 1000 MCG tablet Take 1 tablet (1,000 mcg total) by mouth daily. 30 tablet 0   No current facility-administered medications on file prior to visit.    LABS/IMAGING: No results found for this or any previous visit (from the past 48 hour(s)). No results found.  LIPID PANEL: No results found for: CHOL, TRIG, HDL, CHOLHDL, VLDL, LDLCALC, LDLDIRECT   WEIGHTS: Wt Readings from Last 3 Encounters:  05/04/19  201 lb 9.6 oz (91.4 kg)  03/21/19 197 lb 12 oz (89.7 kg)  03/17/19 206 lb (93.4 kg)    VITALS: BP 131/83   Pulse 61   Temp 98 F (36.7 C)   Ht 6\' 2"  (1.88 m)   Wt 201 lb 9.6 oz (91.4 kg)   SpO2 97%   BMI 25.88 kg/m   EXAM: Deferred  EKG: Deferred  ASSESSMENT: 1. PAF - on Eliquis 2. Recent age indeterminate DVT 3. Large hiatal hernia with severe anemia 4. Chest pain, thought to be due to hiatal hernia  PLAN: 1.   Mr. Motta has evidence of A. fib on monitoring and therefore should likely stay on Eliquis long-term past the normal course of treatment for age-indeterminate DVT.  His chest pain was thought due to hiatal hernia.  His anemia has improved and he is now on iron therapy and PPI.  He should follow-up with Dr. Gardiner Rhyme for his afib and DVT.  Follow-up with me PRN.  Pixie Casino, MD, [facility], [place] Director of the Advanced Lipid Disorders &  Cardiovascular Risk Reduction Clinic Diplomate of the American Board of Clinical Lipidology Attending Cardiologist  Direct Dial: [phone]  Fax: [phone]  Website:  [email or web address] 05/04/2019, 12:33 PM

## 2019-05-04 NOTE — Patient Instructions (Signed)
Medication Instructions:  NO CHANGE *If you need a refill on your cardiac medications before your next appointment, please call your pharmacy*  Follow-Up: At [facility], you and your health needs are our priority.  As part of our continuing mission to provide you with exceptional heart care, we have created designated Provider Care Teams.  These Care Teams include your primary Cardiologist (physician) and Advanced Practice Providers (APPs -  Physician Assistants and Nurse Practitioners) who all work together to provide you with the care you need, when you need it.  We recommend signing up for the patient portal called "MyChart".  Sign up information is provided on this After Visit Summary.  MyChart is used to connect with patients for Virtual Visits (Telemedicine).  Patients are able to view lab/test results, encounter notes, upcoming appointments, etc.  Non-urgent messages can be sent to your provider as well.   To learn more about what you can do with MyChart, go to [email or web address].    Your next appointment:   2-4 week(s)  The format for your next appointment:   In Person  Provider:   Epifanio Lesches, MD   Other Instructions

## 2019-05-17 NOTE — Progress Notes (Addendum)
Cardiology Office Note:    Date:  05/18/2019   ID:  Joshua Estrada, DOB 22-Jul-1940, MRN [id number]  PCP:  Joshua Frees, MD  Cardiologist:  Donato Heinz, MD  Electrophysiologist:  None   Referring MD: Joshua Frees, MD   Chief Complaint  Patient presents with  . Chest Pain    History of Present Illness:    Joshua Estrada is a 79 y.o. male with a hx of paroxysmal atrial fibrillation, DVT/PE severe anemia, HLD, BPH, GERD who presents for follow-up.  He was referred by Dr. Kenton Estrada for evaluation of dyspnea on exertion, initially seen on 03/17/2019 .  As part of the work-up for his dyspnea, CBC was checked and found to have severe anemia (hemoglobin 4.2).  He was admitted to [facility] for further evaluation.  Work-up of his anemia revealed iron deficiency anemia thought to be due to chronic blood loss.  Underwent colonoscopy and EGD, thought that blood loss may be related to large hiatal hernia and he will benefit from iron supplementation and PPI use.  Incidentally during hospitalization was also found to have left lower extremity DVT and CTA positive for multiple PEs.  He was started on heparin drip and transitioned to Eliquis, with stable hemoglobin.  TTE was done during hospitalization, which showed EF 55 to 60%, mild LVH, indeterminate diastolic function, normal RV systolic function, severe left atrial dilatation, moderate right atrial dilatation, no significant valvular disease.  His hospital course was also complicated by development of atrial fibrillation with alternating bradycardia.  30-day monitor was ordered as outpatient and he was not started on AV nodal blocker given bradycardia  Since discharge from the hospital, [name] reports that he has been doing well.  He denies any bleeding.  Has not noticed any blood in stool.  Reports chest pain is improved, but still having some exertional chest pain.  The most exertion he does is walking.  He reports occasional chest tightness and  intermittent dyspnea..  Wore heart monitor for 30 days, states that he turned it in a few weeks ago   Past Medical History:  Diagnosis Date  . BPH (benign prostatic hyperplasia)   . Hyperlipidemia     Past Surgical History:  Procedure Laterality Date  . BIOPSY  03/21/2019   Procedure: BIOPSY;  Surgeon: Joshua Juniper, MD;  Location: [facility] ENDOSCOPY;  Service: Gastroenterology;;  . Cataract surgery    . COLONOSCOPY WITH PROPOFOL N/A 03/21/2019   Procedure: COLONOSCOPY WITH PROPOFOL;  Surgeon: Joshua Juniper, MD;  Location: [place];  Service: Gastroenterology;  Laterality: N/A;  . ESOPHAGOGASTRODUODENOSCOPY (EGD) WITH PROPOFOL N/A 03/21/2019   Procedure: ESOPHAGOGASTRODUODENOSCOPY (EGD) WITH PROPOFOL;  Surgeon: Joshua Juniper, MD;  Location: [place];  Service: Gastroenterology;  Laterality: N/A;  . POLYPECTOMY  03/21/2019   Procedure: POLYPECTOMY;  Surgeon: Joshua Juniper, MD;  Location: [place];  Service: Gastroenterology;;  . TONSILLECTOMY     As a child    Current Medications: Current Meds  Medication Sig  . Apixaban Starter Pack (ELIQUIS STARTER PACK) 5 MG TBPK Take as directed on package: start with two-5mg  tablets twice daily for 7 days. On day 8, switch to one-5mg  tablet twice daily.  . ferrous sulfate 325 (65 FE) MG tablet Take 1 tablet (325 mg total) by mouth 2 (two) times daily with a meal.  . Multiple Vitamins-Minerals (PRESERVISION AREDS 2 PO) Take 1 tablet by mouth 2 (two) times daily.   . pantoprazole (PROTONIX) 40 MG tablet Take 1 tablet (40 mg total) by mouth  daily.  . simvastatin (ZOCOR) 20 MG tablet Take 20 mg by mouth at bedtime.  . tamsulosin (FLOMAX) 0.4 MG CAPS capsule Take 0.4 mg by mouth at bedtime.   . vitamin B-12 1000 MCG tablet Take 1 tablet (1,000 mcg total) by mouth daily.     Allergies:   Patient has no known allergies.   Social History   Socioeconomic History  . Marital status: Single    Spouse name: Not on file  . Number of children: Not on file    . Years of education: Not on file  . Highest education level: Not on file  Occupational History  . Not on file  Tobacco Use  . Smoking status: Never Smoker  . Smokeless tobacco: Never Used  Substance and Sexual Activity  . Alcohol use: Never  . Drug use: Never  . Sexual activity: Not on file  Other Topics Concern  . Not on file  Social History Narrative  . Not on file   Social Determinants of Health   Financial Resource Strain:   . Difficulty of Paying Living Expenses:   Food Insecurity:   . Worried About Programme researcher, broadcasting/film/video in the Last Year:   . Barista in the Last Year:   Transportation Needs:   . Freight forwarder (Medical):   Marland Kitchen Lack of Transportation (Non-Medical):   Physical Activity:   . Days of Exercise per Week:   . Minutes of Exercise per Session:   Stress:   . Feeling of Stress :   Social Connections:   . Frequency of Communication with Friends and Family:   . Frequency of Social Gatherings with Friends and Family:   . Attends Religious Services:   . Active Member of Clubs or Organizations:   . Attends Banker Meetings:   Marland Kitchen Marital Status:      Family History: Father had CHF  ROS:   Please see the history of present illness.    All other systems reviewed and are negative.  EKGs/Labs/Other Studies Reviewed:    The following studies were reviewed today:   EKG:  EKG is ordered today.  The ekg ordered today demonstrates sinus rhythm with significant sinus arrhythmia, rate 59  TTE 03/19/19: 1. Left ventricular ejection fraction, by visual estimation, is 55 to  60%. The left ventricle has normal function. Left ventricular septal wall  thickness was mildly increased. Mildly increased left ventricular  posterior wall thickness. There is mildly  increased left ventricular hypertrophy.  2. Left ventricular diastolic parameters are indeterminate.  3. The left ventricle has no regional wall motion abnormalities.  4. Global  right ventricle has normal systolic function.The right  ventricular size is normal. No increase in right ventricular wall  thickness.  5. Left atrial size was severely dilated.  6. Right atrial size was moderately dilated.  7. The mitral valve is normal in structure. Trivial mitral valve  regurgitation. No evidence of mitral stenosis.  8. The tricuspid valve is normal in structure.  9. The aortic valve is normal in structure. Aortic valve regurgitation is  not visualized. No evidence of aortic valve sclerosis or stenosis.  10. Pulmonic regurgitation is mild.  11. The pulmonic valve was normal in structure. Pulmonic valve  regurgitation is mild.  12. Normal pulmonary artery systolic pressure.  13. The inferior vena cava is normal in size with greater than 50%  respiratory variability, suggesting right atrial pressure of 3 mmHg.   Recent Labs: 03/17/2019: NT-Pro BNP  614 03/18/2019: ALT 15; TSH 1.383 03/22/2019: BUN 9; Creatinine, Ser 0.92; Potassium 3.7; Sodium 141 03/24/2019: Hemoglobin 9.5; Platelets 237  Recent Lipid Panel No results found for: CHOL, TRIG, HDL, CHOLHDL, VLDL, LDLCALC, LDLDIRECT  Physical Exam:    VS:  BP 130/82   Pulse (!) 59   Temp (!) 96.3 F (35.7 C)   Ht 6\' 1"  (1.854 m)   Wt 202 lb (91.6 kg)   SpO2 96%   BMI 26.65 kg/m     Wt Readings from Last 3 Encounters:  05/18/19 202 lb (91.6 kg)  05/04/19 201 lb 9.6 oz (91.4 kg)  03/21/19 197 lb 12 oz (89.7 kg)     GEN:   in no acute distress HEENT: Normal NECK: No JVD LYMPHATICS: No lymphadenopathy CARDIAC: Irregular, 2/6 systolic murmur RESPIRATORY:  Clear to auscultation without rales, wheezing or rhonchi  ABDOMEN: Soft, non-tender, non-distended MUSCULOSKELETAL:  1+ BLE edema SKIN: Warm and dry NEUROLOGIC:  Alert and oriented x 3 PSYCHIATRIC:  Normal affect   ASSESSMENT:    1. PAF (paroxysmal atrial fibrillation) (HCC)   2. Precordial pain   3. Anemia, unspecified type   4. Hyperlipidemia,  unspecified hyperlipidemia type   5. Acute deep vein thrombosis (DVT) of other vein of lower extremity, unspecified laterality (HCC)   6. Other pulmonary embolism without acute cor pulmonale, unspecified chronicity (HCC)    PLAN:    Paroxysmal atrial fibrillation: CHA2DS2-VASc score 3 (agex2, DVT/PE).  Has not been on rate controlling agent given bradycardia -Follow-up 30-day monitor -Continue Eliquis 5 mg twice daily.  Will check CBC to ensure stable H/H given recent severe anemia thought to be 2/2 GI bleed   PE/DVT: Diagnosed during recent hospitalization for anemia, started on Eliquis 5 mg twice daily as above  Chest pain: Description suggests angina, as describes dull aching pain in chest with exertion that resolves with rest.  Has improved with resolution of his anemia, but continues to have some chest pain.  Not a good coronary CTA candidate given irregular heart rhythm.  Will check Lexiscan Myoview  Hyperlipidemia: On simvastatin 20 mg daily.  LDL 51 on 04/03/19  RTC in 1 month  Medication Adjustments/Labs and Tests Ordered: Current medicines are reviewed at length with the patient today.  Concerns regarding medicines are outlined above.  Orders Placed This Encounter  Procedures  . CBC  . EKG 12-Lead   No orders of the defined types were placed in this encounter.   Patient Instructions  Medication Instructions:  Your physician recommends that you continue on your current medications as directed. Please refer to the Current Medication list given to you today.  *If you need a refill on your cardiac medications before your next appointment, please call your pharmacy*   Lab Work: Return for CBC at your convenience  If you have labs (blood work) drawn today and your tests are completely normal, you will receive your results only by: 04/05/19 MyChart Message (if you have MyChart) OR . A paper copy in the mail If you have any lab test that is abnormal or we need to change your  treatment, we will call you to review the results.   Testing/Procedures: Your physician has requested that you have a lexiscan myoview. For further information please visit Marland Kitchen. Please follow instruction sheet, as given.   Follow-Up: At [facility], you and your health needs are our priority.  As part of our continuing mission to provide you with exceptional heart care, we have created designated Provider  Care Teams.  These Care Teams include your primary Cardiologist (physician) and Advanced Practice Providers (APPs -  Physician Assistants and Nurse Practitioners) who all work together to provide you with the care you need, when you need it.  We recommend signing up for the patient portal called "MyChart".  Sign up information is provided on this After Visit Summary.  MyChart is used to connect with patients for Virtual Visits (Telemedicine).  Patients are able to view lab/test results, encounter notes, upcoming appointments, etc.  Non-urgent messages can be sent to your provider as well.   To learn more about what you can do with MyChart, go to [email or web address].    Your next appointment:   1 month(s)  The format for your next appointment:   Either In Person or Virtual  Provider:   Epifanio Lesches, MD        Signed, Little Ishikawa, MD  05/18/2019 5:23 PM    [facility]

## 2019-05-18 ENCOUNTER — Other Ambulatory Visit: Payer: Self-pay

## 2019-05-18 ENCOUNTER — Encounter: Payer: Self-pay | Admitting: Cardiology

## 2019-05-18 ENCOUNTER — Ambulatory Visit (INDEPENDENT_AMBULATORY_CARE_PROVIDER_SITE_OTHER): Payer: Federal, State, Local not specified - PPO | Admitting: Cardiology

## 2019-05-18 VITALS — BP 130/82 | HR 59 | Temp 96.3°F | Ht 73.0 in | Wt 202.0 lb

## 2019-05-18 DIAGNOSIS — I2699 Other pulmonary embolism without acute cor pulmonale: Secondary | ICD-10-CM

## 2019-05-18 DIAGNOSIS — D649 Anemia, unspecified: Secondary | ICD-10-CM

## 2019-05-18 DIAGNOSIS — I82409 Acute embolism and thrombosis of unspecified deep veins of unspecified lower extremity: Secondary | ICD-10-CM

## 2019-05-18 DIAGNOSIS — I480 Paroxysmal atrial fibrillation: Secondary | ICD-10-CM

## 2019-05-18 DIAGNOSIS — R072 Precordial pain: Secondary | ICD-10-CM

## 2019-05-18 DIAGNOSIS — E785 Hyperlipidemia, unspecified: Secondary | ICD-10-CM | POA: Diagnosis not present

## 2019-05-18 NOTE — Patient Instructions (Signed)
Medication Instructions:  Your physician recommends that you continue on your current medications as directed. Please refer to the Current Medication list given to you today.  *If you need a refill on your cardiac medications before your next appointment, please call your pharmacy*   Lab Work: Return for CBC at your convenience  If you have labs (blood work) drawn today and your tests are completely normal, you will receive your results only by: Marland Kitchen MyChart Message (if you have MyChart) OR . A paper copy in the mail If you have any lab test that is abnormal or we need to change your treatment, we will call you to review the results.   Testing/Procedures: Your physician has requested that you have a lexiscan myoview. For further information please visit [email or web address] Please follow instruction sheet, as given.   Follow-Up: At [facility], you and your health needs are our priority.  As part of our continuing mission to provide you with exceptional heart care, we have created designated Provider Care Teams.  These Care Teams include your primary Cardiologist (physician) and Advanced Practice Providers (APPs -  Physician Assistants and Nurse Practitioners) who all work together to provide you with the care you need, when you need it.  We recommend signing up for the patient portal called "MyChart".  Sign up information is provided on this After Visit Summary.  MyChart is used to connect with patients for Virtual Visits (Telemedicine).  Patients are able to view lab/test results, encounter notes, upcoming appointments, etc.  Non-urgent messages can be sent to your provider as well.   To learn more about what you can do with MyChart, go to [email or web address].    Your next appointment:   1 month(s)  The format for your next appointment:   Either In Person or Virtual  Provider:   Epifanio Lesches, MD

## 2019-05-19 ENCOUNTER — Encounter: Payer: Self-pay | Admitting: Cardiology

## 2019-05-19 ENCOUNTER — Ambulatory Visit: Payer: Federal, State, Local not specified - PPO | Admitting: Cardiology

## 2019-05-19 DIAGNOSIS — I480 Paroxysmal atrial fibrillation: Secondary | ICD-10-CM | POA: Diagnosis not present

## 2019-05-19 DIAGNOSIS — D649 Anemia, unspecified: Secondary | ICD-10-CM | POA: Diagnosis not present

## 2019-05-20 ENCOUNTER — Telehealth: Payer: Self-pay | Admitting: Cardiology

## 2019-05-20 LAB — CBC
Hematocrit: 40.7 % (ref 37.5–51.0)
Hemoglobin: 13.9 g/dL (ref 13.0–17.7)
MCH: 31.5 pg (ref 26.6–33.0)
MCHC: 34.2 g/dL (ref 31.5–35.7)
MCV: 92 fL (ref 79–97)
Platelets: 160 10*3/uL (ref 150–450)
RBC: 4.41 x10E6/uL (ref 4.14–5.80)
RDW: 19.8 % — ABNORMAL HIGH (ref 11.6–15.4)
WBC: 5.5 10*3/uL (ref 3.4–10.8)

## 2019-05-20 NOTE — Telephone Encounter (Signed)
Patient returning call for lab results. 

## 2019-05-20 NOTE — Telephone Encounter (Signed)
I spoke with patient and reviewed CBC results with him

## 2019-05-22 ENCOUNTER — Telehealth (HOSPITAL_COMMUNITY): Payer: Self-pay

## 2019-05-22 NOTE — Telephone Encounter (Signed)
Encounter complete. 

## 2019-05-27 ENCOUNTER — Other Ambulatory Visit: Payer: Self-pay

## 2019-05-27 ENCOUNTER — Ambulatory Visit (HOSPITAL_COMMUNITY)
Admission: RE | Admit: 2019-05-27 | Discharge: 2019-05-27 | Disposition: A | Discharge status: Home / Self Care | Payer: Federal, State, Local not specified - PPO | Source: Ambulatory Visit | Attending: Cardiology | Admitting: Cardiology

## 2019-05-27 DIAGNOSIS — R072 Precordial pain: Secondary | ICD-10-CM

## 2019-05-27 LAB — MYOCARDIAL PERFUSION IMAGING
Peak HR: 98 {beats}/min
Rest HR: 62 {beats}/min
SDS: 4
SRS: 1
SSS: 5
TID: 1.09

## 2019-05-27 MED ORDER — TECHNETIUM TC 99M TETROFOSMIN IV KIT
32.0000 | PACK | Freq: Once | INTRAVENOUS | Status: AC | PRN
  Administered 2019-05-27: 32 via INTRAVENOUS
  Filled 2019-05-27: qty 32

## 2019-05-27 MED ORDER — REGADENOSON 0.4 MG/5ML IV SOLN
0.4000 mg | Freq: Once | INTRAVENOUS | Status: AC
  Administered 2019-05-27: 0.4 mg via INTRAVENOUS

## 2019-05-27 MED ORDER — TECHNETIUM TC 99M TETROFOSMIN IV KIT
10.9000 | PACK | Freq: Once | INTRAVENOUS | Status: AC | PRN
  Administered 2019-05-27: 10.9 via INTRAVENOUS
  Filled 2019-05-27: qty 11

## 2019-05-29 ENCOUNTER — Telehealth: Payer: Self-pay | Admitting: Internal Medicine

## 2019-05-29 NOTE — Telephone Encounter (Signed)
Spoke with patient. Results reviewed for myocardial perfusion imaging. Patient verbalized understanding.

## 2019-05-29 NOTE — Telephone Encounter (Signed)
Patient states he is returning call to [facility], requesting results from Myocardial Perfusion completed on 05/27/19. Please call to discuss.

## 2019-06-01 DIAGNOSIS — D508 Other iron deficiency anemias: Secondary | ICD-10-CM | POA: Diagnosis not present

## 2019-06-01 DIAGNOSIS — I480 Paroxysmal atrial fibrillation: Secondary | ICD-10-CM | POA: Diagnosis not present

## 2019-06-01 DIAGNOSIS — I2694 Multiple subsegmental pulmonary emboli without acute cor pulmonale: Secondary | ICD-10-CM | POA: Diagnosis not present

## 2019-06-01 DIAGNOSIS — E7800 Pure hypercholesterolemia, unspecified: Secondary | ICD-10-CM | POA: Diagnosis not present

## 2019-06-16 NOTE — Progress Notes (Signed)
Cardiology Office Note:    Date:  06/18/2019   ID:  Joshua Estrada, DOB Jul 06, 1940, MRN [id number]  PCP:  Johny Blamer, MD  Cardiologist:  Little Ishikawa, MD  Electrophysiologist:  None   Referring MD: Johny Blamer, MD   No chief complaint on file.   History of Present Illness:    Joshua Estrada is a 79 y.o. male with a hx of paroxysmal atrial fibrillation, DVT/PE severe anemia, HLD, BPH, GERD who presents for follow-up.  He was referred by Dr. Tiburcio Pea for evaluation of dyspnea on exertion, initially seen on 03/17/2019 .  As part of the work-up for his dyspnea, CBC was checked and found to have severe anemia (hemoglobin 4.2).  He was admitted to [facility] for further evaluation.  Work-up of his anemia revealed iron deficiency anemia thought to be due to chronic blood loss.  Underwent colonoscopy and EGD, thought that blood loss may be related to large hiatal hernia and he will benefit from iron supplementation and PPI use.  Incidentally during hospitalization was also found to have left lower extremity DVT and CTA positive for multiple PEs.  He was started on heparin drip and transitioned to Eliquis, with stable hemoglobin.  TTE was done during hospitalization, which showed EF 55 to 60%, mild LVH, indeterminate diastolic function, normal RV systolic function, severe left atrial dilatation, moderate right atrial dilatation, no significant valvular disease.  His hospital course was also complicated by development of atrial fibrillation with alternating bradycardia.  30-day monitor was ordered as outpatient and he was not started on AV nodal blocker given bradycardia.  30-day monitor showed total AF burden 1%, with longest AF episode lasting 2 hours with average heart rate 105 bpm.  He underwent a Lexiscan Myoview on 05/27/2019 which showed no perfusion defects.  Since last clinic visit, denies any palpitations.  Reports continues to have intermittent chest pain.  Had some today.  Reports most activity  is walkig up stairs.  No chest pain with stairs.  Denies any blood instool.  Feels like he is breathing OK.     Past Medical History:  Diagnosis Date  . BPH (benign prostatic hyperplasia)   . Hyperlipidemia     Past Surgical History:  Procedure Laterality Date  . BIOPSY  03/21/2019   Procedure: BIOPSY;  Surgeon: Kerin Salen, MD;  Location: [facility] ENDOSCOPY;  Service: Gastroenterology;;  . Cataract surgery    . COLONOSCOPY WITH PROPOFOL N/A 03/21/2019   Procedure: COLONOSCOPY WITH PROPOFOL;  Surgeon: Kerin Salen, MD;  Location: [facility] ENDOSCOPY;  Service: Gastroenterology;  Laterality: N/A;  . ESOPHAGOGASTRODUODENOSCOPY (EGD) WITH PROPOFOL N/A 03/21/2019   Procedure: ESOPHAGOGASTRODUODENOSCOPY (EGD) WITH PROPOFOL;  Surgeon: Kerin Salen, MD;  Location: [facility] ENDOSCOPY;  Service: Gastroenterology;  Laterality: N/A;  . POLYPECTOMY  03/21/2019   Procedure: POLYPECTOMY;  Surgeon: Kerin Salen, MD;  Location: [facility] ENDOSCOPY;  Service: Gastroenterology;;  . TONSILLECTOMY     As a child    Current Medications: Current Meds  Medication Sig  . ELIQUIS 5 MG TABS tablet Take 5 mg by mouth 2 (two) times daily.  . ferrous sulfate 325 (65 FE) MG tablet Take 1 tablet (325 mg total) by mouth 2 (two) times daily with a meal.  . Multiple Vitamins-Minerals (PRESERVISION AREDS 2 PO) Take 1 tablet by mouth 2 (two) times daily.   . pantoprazole (PROTONIX) 40 MG tablet Take 1 tablet (40 mg total) by mouth daily.  . simvastatin (ZOCOR) 20 MG tablet Take 20 mg by mouth at bedtime.  Marland Kitchen  tamsulosin (FLOMAX) 0.4 MG CAPS capsule Take 0.4 mg by mouth at bedtime.   . vitamin B-12 1000 MCG tablet Take 1 tablet (1,000 mcg total) by mouth daily.     Allergies:   Patient has no known allergies.   Social History   Socioeconomic History  . Marital status: Single    Spouse name: Not on file  . Number of children: Not on file  . Years of education: Not on file  . Highest education level: Not on file  Occupational History  . Not  on file  Tobacco Use  . Smoking status: Never Smoker  . Smokeless tobacco: Never Used  Substance and Sexual Activity  . Alcohol use: Never  . Drug use: Never  . Sexual activity: Not on file  Other Topics Concern  . Not on file  Social History Narrative  . Not on file   Social Determinants of Health   Financial Resource Strain:   . Difficulty of Paying Living Expenses:   Food Insecurity:   . Worried About Charity fundraiser in the Last Year:   . Arboriculturist in the Last Year:   Transportation Needs:   . Film/video editor (Medical):   Marland Kitchen Lack of Transportation (Non-Medical):   Physical Activity:   . Days of Exercise per Week:   . Minutes of Exercise per Session:   Stress:   . Feeling of Stress :   Social Connections:   . Frequency of Communication with Friends and Family:   . Frequency of Social Gatherings with Friends and Family:   . Attends Religious Services:   . Active Member of Clubs or Organizations:   . Attends Archivist Meetings:   Marland Kitchen Marital Status:      Family History: Father had CHF  ROS:   Please see the history of present illness.    All other systems reviewed and are negative.  EKGs/Labs/Other Studies Reviewed:    The following studies were reviewed today:   EKG:  EKG is ordered today.  The ekg ordered today demonstrates sinus rhythm with PACs, rate 62, no ST/T abnormalities  TTE 03/19/19: 1. Left ventricular ejection fraction, by visual estimation, is 55 to  60%. The left ventricle has normal function. Left ventricular septal wall  thickness was mildly increased. Mildly increased left ventricular  posterior wall thickness. There is mildly  increased left ventricular hypertrophy.  2. Left ventricular diastolic parameters are indeterminate.  3. The left ventricle has no regional wall motion abnormalities.  4. Global right ventricle has normal systolic function.The right  ventricular size is normal. No increase in right  ventricular wall  thickness.  5. Left atrial size was severely dilated.  6. Right atrial size was moderately dilated.  7. The mitral valve is normal in structure. Trivial mitral valve  regurgitation. No evidence of mitral stenosis.  8. The tricuspid valve is normal in structure.  9. The aortic valve is normal in structure. Aortic valve regurgitation is  not visualized. No evidence of aortic valve sclerosis or stenosis.  10. Pulmonic regurgitation is mild.  11. The pulmonic valve was normal in structure. Pulmonic valve  regurgitation is mild.  12. Normal pulmonary artery systolic pressure.  13. The inferior vena cava is normal in size with greater than 50%  respiratory variability, suggesting right atrial pressure of 3 mmHg.   Lexiscan Myoview 05/27/2019:  There was no ST segment deviation noted during stress.  Defect 1: There is a small defect of  moderate severity present in the apical inferior location. It was present at rest and not at stress, consistent with artifact.  The study is normal.  This is a low risk study.  This study was not gated for systolic function due to frequent PACs and PVCs.     Cardiac monitor 05/22/19:  Total AF burden 1%. Longest AF episode lasted 2 hours 9 minutes, average HR 105bpm. Fastest episode with average rate 124 bpm.   Predominant rhythm is sinus rhythm. Range is 43 to 158 bpm bpm with average of 72 bpm. No sustained ventricular tachycardia, significant pause, or high degree AV block. Ventricular ectopy 1%. 9 patient triggered events, which corresponded to sinus rhythm +/- PACs. 4 beat run of NSVT.  Total AF burden 1%.  Longest episode lasted 2 hours 9 minutes, average HR 105bpm.  Fastest episode with average rate 124 bpm.  Also with 2 episodes of atrial flutter with variable conduction, rate 70s.  Recent Labs: 03/17/2019: NT-Pro BNP 614 03/18/2019: ALT 15; TSH 1.383 03/22/2019: BUN 9; Creatinine, Ser 0.92; Potassium 3.7; Sodium 141 05/19/2019:  Hemoglobin 13.9; Platelets 160  Recent Lipid Panel No results found for: CHOL, TRIG, HDL, CHOLHDL, VLDL, LDLCALC, LDLDIRECT  Physical Exam:    VS:  BP 136/78   Pulse 73   Ht 6\' 1"  (1.854 m)   Wt 200 lb 6.4 oz (90.9 kg)   SpO2 97%   BMI 26.44 kg/m     Wt Readings from Last 3 Encounters:  06/18/19 200 lb 6.4 oz (90.9 kg)  05/27/19 202 lb (91.6 kg)  05/18/19 202 lb (91.6 kg)     GEN:   in no acute distress HEENT: Normal NECK: No JVD LYMPHATICS: No lymphadenopathy CARDIAC: Irregular, 2/6 systolic murmur RESPIRATORY:  Clear to auscultation without rales, wheezing or rhonchi  ABDOMEN: Soft, non-tender, non-distended MUSCULOSKELETAL:  1+ BLE edema SKIN: Warm and dry NEUROLOGIC:  Alert and oriented x 3 PSYCHIATRIC:  Normal affect   ASSESSMENT:    1. PAF (paroxysmal atrial fibrillation) (HCC)   2. Precordial pain   3. Other pulmonary embolism without acute cor pulmonale, unspecified chronicity (HCC)   4. Hyperlipidemia, unspecified hyperlipidemia type    PLAN:    Paroxysmal atrial fibrillation: CHA2DS2-VASc score 3 (agex2, DVT/PE).  Has not been on rate controlling agent given bradycardia while in the hospital.  30-day monitor showed AF burden 1%, with longest episode lasting 2 hours with average heart rate 105 bpm -Continue Eliquis 5 mg twice daily -Given episodes of RVR on monitor, will start low dose metoprolol 12.5 mg twice daily  PE/DVT: Diagnosed during recent hospitalization for anemia, started on Eliquis 5 mg twice daily as above  Chest pain: describes dull aching pain in chest with exertiont.  Lexiscan Myoview shows no evidence of ischemia  Hyperlipidemia: On simvastatin 20 mg daily.  LDL 51 on 04/03/19  RTC in 3 months  Medication Adjustments/Labs and Tests Ordered: Current medicines are reviewed at length with the patient today.  Concerns regarding medicines are outlined above.  Orders Placed This Encounter  Procedures  . EKG 12-Lead   Meds ordered this  encounter  Medications  . metoprolol tartrate (LOPRESSOR) 25 MG tablet    Sig: Take 0.5 tablets (12.5 mg total) by mouth 2 (two) times daily.    Dispense:  90 tablet    Refill:  3    Patient Instructions  Medication Instructions:  START metoprolol tartrate (Lopressor) 12.5 mg (1/2 tablet) two times daily  *If you need a refill on  your cardiac medications before your next appointment, please call your pharmacy*  Follow-Up: At [facility], you and your health needs are our priority.  As part of our continuing mission to provide you with exceptional heart care, we have created designated Provider Care Teams.  These Care Teams include your primary Cardiologist (physician) and Advanced Practice Providers (APPs -  Physician Assistants and Nurse Practitioners) who all work together to provide you with the care you need, when you need it.  We recommend signing up for the patient portal called "MyChart".  Sign up information is provided on this After Visit Summary.  MyChart is used to connect with patients for Virtual Visits (Telemedicine).  Patients are able to view lab/test results, encounter notes, upcoming appointments, etc.  Non-urgent messages can be sent to your provider as well.   To learn more about what you can do with MyChart, go to [email or web address].    Your next appointment:   3 month(s)  The format for your next appointment:   In Person  Provider:   Epifanio Lesches, MD         Signed, Little Ishikawa, MD  06/18/2019 5:31 PM    [facility]

## 2019-06-18 ENCOUNTER — Ambulatory Visit: Payer: Federal, State, Local not specified - PPO | Admitting: Cardiology

## 2019-06-18 ENCOUNTER — Other Ambulatory Visit: Payer: Self-pay

## 2019-06-18 ENCOUNTER — Encounter: Payer: Self-pay | Admitting: Cardiology

## 2019-06-18 VITALS — BP 136/78 | HR 73 | Ht 73.0 in | Wt 200.4 lb

## 2019-06-18 DIAGNOSIS — I2699 Other pulmonary embolism without acute cor pulmonale: Secondary | ICD-10-CM

## 2019-06-18 DIAGNOSIS — R072 Precordial pain: Secondary | ICD-10-CM

## 2019-06-18 DIAGNOSIS — E785 Hyperlipidemia, unspecified: Secondary | ICD-10-CM

## 2019-06-18 DIAGNOSIS — I480 Paroxysmal atrial fibrillation: Secondary | ICD-10-CM | POA: Diagnosis not present

## 2019-06-18 MED ORDER — METOPROLOL TARTRATE 25 MG PO TABS: 12.5000 mg | ORAL_TABLET | Freq: Two times a day (BID) | ORAL | 3 refills | Status: DC

## 2019-06-18 NOTE — Patient Instructions (Signed)
Medication Instructions:  START metoprolol tartrate (Lopressor) 12.5 mg (1/2 tablet) two times daily  *If you need a refill on your cardiac medications before your next appointment, please call your pharmacy*  Follow-Up: At [facility], you and your health needs are our priority.  As part of our continuing mission to provide you with exceptional heart care, we have created designated Provider Care Teams.  These Care Teams include your primary Cardiologist (physician) and Advanced Practice Providers (APPs -  Physician Assistants and Nurse Practitioners) who all work together to provide you with the care you need, when you need it.  We recommend signing up for the patient portal called "MyChart".  Sign up information is provided on this After Visit Summary.  MyChart is used to connect with patients for Virtual Visits (Telemedicine).  Patients are able to view lab/test results, encounter notes, upcoming appointments, etc.  Non-urgent messages can be sent to your provider as well.   To learn more about what you can do with MyChart, go to [email or web address].    Your next appointment:   3 month(s)  The format for your next appointment:   In Person  Provider:   Epifanio Lesches, MD

## 2019-07-27 DIAGNOSIS — H353122 Nonexudative age-related macular degeneration, left eye, intermediate dry stage: Secondary | ICD-10-CM | POA: Diagnosis not present

## 2019-07-27 DIAGNOSIS — H35021 Exudative retinopathy, right eye: Secondary | ICD-10-CM | POA: Diagnosis not present

## 2019-07-27 DIAGNOSIS — H442E3 Degenerative myopia with other maculopathy, bilateral eye: Secondary | ICD-10-CM | POA: Diagnosis not present

## 2019-07-27 DIAGNOSIS — H34831 Tributary (branch) retinal vein occlusion, right eye, with macular edema: Secondary | ICD-10-CM | POA: Diagnosis not present

## 2019-08-05 DIAGNOSIS — H3321 Serous retinal detachment, right eye: Secondary | ICD-10-CM | POA: Diagnosis not present

## 2019-09-16 ENCOUNTER — Other Ambulatory Visit: Payer: Self-pay

## 2019-09-16 ENCOUNTER — Encounter: Payer: Self-pay | Admitting: Cardiology

## 2019-09-16 ENCOUNTER — Ambulatory Visit: Payer: Federal, State, Local not specified - PPO | Admitting: Cardiology

## 2019-09-16 VITALS — BP 144/82 | HR 63 | Temp 96.7°F | Ht 73.0 in | Wt 209.0 lb

## 2019-09-16 DIAGNOSIS — I2699 Other pulmonary embolism without acute cor pulmonale: Secondary | ICD-10-CM

## 2019-09-16 DIAGNOSIS — R072 Precordial pain: Secondary | ICD-10-CM

## 2019-09-16 DIAGNOSIS — E785 Hyperlipidemia, unspecified: Secondary | ICD-10-CM | POA: Diagnosis not present

## 2019-09-16 DIAGNOSIS — I480 Paroxysmal atrial fibrillation: Secondary | ICD-10-CM | POA: Diagnosis not present

## 2019-09-16 NOTE — Patient Instructions (Signed)
Medication Instructions:  Your physician recommends that you continue on your current medications as directed. Please refer to the Current Medication list given to you today.  Follow-Up: At [facility], you and your health needs are our priority.  As part of our continuing mission to provide you with exceptional heart care, we have created designated Provider Care Teams.  These Care Teams include your primary Cardiologist (physician) and Advanced Practice Providers (APPs -  Physician Assistants and Nurse Practitioners) who all work together to provide you with the care you need, when you need it.  We recommend signing up for the patient portal called "MyChart".  Sign up information is provided on this After Visit Summary.  MyChart is used to connect with patients for Virtual Visits (Telemedicine).  Patients are able to view lab/test results, encounter notes, upcoming appointments, etc.  Non-urgent messages can be sent to your provider as well.   To learn more about what you can do with MyChart, go to [email or web address].    Your next appointment:   6 month(s)  The format for your next appointment:   In Person  Provider:   Christopher Schumann, MD     

## 2019-09-16 NOTE — Progress Notes (Signed)
Cardiology Office Note:    Date:  09/18/2019   ID:  Joshua Estrada, DOB 27-Oct-1940, MRN [id number]  PCP:  Joshua Blamer, MD  Cardiologist:  Joshua Ishikawa, MD  Electrophysiologist:  None   Referring MD: Joshua Blamer, MD   Chief Complaint  Patient presents with  . Atrial Fibrillation    History of Present Illness:    Joshua Estrada is a 79 y.o. male with a hx of paroxysmal atrial fibrillation, DVT/PE severe anemia, HLD, BPH, GERD who presents for follow-up.  He was referred by Dr. Tiburcio Estrada for evaluation of dyspnea on exertion, initially seen on 03/17/2019 .  As part of the work-up for his dyspnea, CBC was checked and found to have severe anemia (hemoglobin 4.2).  He was admitted to [facility] for further evaluation.  Work-up of his anemia revealed iron deficiency anemia thought to be due to chronic blood loss.  Underwent colonoscopy and EGD, thought that blood loss may be related to large hiatal hernia and he will benefit from iron supplementation and PPI use.  Incidentally during hospitalization was also found to have left lower extremity DVT and CTA positive for multiple PEs.  He was started on heparin drip and transitioned to Eliquis, with stable hemoglobin.  TTE was done during hospitalization, which showed EF 55 to 60%, mild LVH, indeterminate diastolic function, normal RV systolic function, severe left atrial dilatation, moderate right atrial dilatation, no significant valvular disease.  His hospital course was also complicated by development of atrial fibrillation with alternating bradycardia.  30-day monitor was ordered as outpatient and he was not started on AV nodal blocker given bradycardia.  30-day monitor showed total AF burden 1%, with longest AF episode lasting 2 hours with average heart rate 105 bpm.  He underwent a Lexiscan Myoview on 05/27/2019 which showed no perfusion defects.  Since last clinic visit, reports that he has been doing well.  He denies any chest pain or dyspnea.   States that he has not been exercising.  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  Most exertion he does is going up and down stairs, he denies any exertional symptoms.  He denies any lightheadedness, syncope, or palpitations.  He has been taking Eliquis and denies any bleeding issues.   Past Medical History:  Diagnosis Date  . BPH (benign prostatic hyperplasia)   . Hyperlipidemia     Past Surgical History:  Procedure Laterality Date  . BIOPSY  03/21/2019   Procedure: BIOPSY;  Surgeon: Joshua Salen, MD;  Location: [facility] ENDOSCOPY;  Service: Gastroenterology;;  . Cataract surgery    . COLONOSCOPY WITH PROPOFOL N/A 03/21/2019   Procedure: COLONOSCOPY WITH PROPOFOL;  Surgeon: Joshua Salen, MD;  Location: [facility] ENDOSCOPY;  Service: Gastroenterology;  Laterality: N/A;  . ESOPHAGOGASTRODUODENOSCOPY (EGD) WITH PROPOFOL N/A 03/21/2019   Procedure: ESOPHAGOGASTRODUODENOSCOPY (EGD) WITH PROPOFOL;  Surgeon: Joshua Salen, MD;  Location: [facility] ENDOSCOPY;  Service: Gastroenterology;  Laterality: N/A;  . POLYPECTOMY  03/21/2019   Procedure: POLYPECTOMY;  Surgeon: Joshua Salen, MD;  Location: [facility] ENDOSCOPY;  Service: Gastroenterology;;  . TONSILLECTOMY     As a child    Current Medications: Current Meds  Medication Sig  . ELIQUIS 5 MG TABS tablet Take 5 mg by mouth 2 (two) times daily.  . ferrous sulfate 325 (65 FE) MG tablet Take 1 tablet (325 mg total) by mouth 2 (two) times daily with a meal.  . metoprolol tartrate (LOPRESSOR) 25 MG tablet Take 0.5 tablets (12.5 mg total) by mouth 2 (  two) times daily.  . Multiple Vitamins-Minerals (PRESERVISION AREDS 2 PO) Take 1 tablet by mouth 2 (two) times daily.   . pantoprazole (PROTONIX) 40 MG tablet Take 1 tablet (40 mg total) by mouth daily.  . simvastatin (ZOCOR) 20 MG tablet Take 20 mg by mouth at bedtime.  . tamsulosin (FLOMAX) 0.4 MG CAPS capsule Take 0.4 mg by mouth at bedtime.   . vitamin B-12 1000 MCG tablet Take 1 tablet (1,000 mcg total) by  mouth daily.     Allergies:   Patient has no known allergies.   Social History   Socioeconomic History  . Marital status: Single    Spouse name: Not on file  . Number of children: Not on file  . Years of education: Not on file  . Highest education level: Not on file  Occupational History  . Not on file  Tobacco Use  . Smoking status: Never Smoker  . Smokeless tobacco: Never Used  Vaping Use  . Vaping Use: Never used  Substance and Sexual Activity  . Alcohol use: Never  . Drug use: Never  . Sexual activity: Not on file  Other Topics Concern  . Not on file  Social History Narrative  . Not on file   Social Determinants of Health   Financial Resource Strain:   . Difficulty of Paying Living Expenses:   Food Insecurity:   . Worried About Programme researcher, broadcasting/film/videounning Out of Food in the Last Year:   . Baristaan Out of Food in the Last Year:   Transportation Needs:   . Freight forwarderLack of Transportation (Medical):   Marland Kitchen. Lack of Transportation (Non-Medical):   Physical Activity:   . Days of Exercise per Week:   . Minutes of Exercise per Session:   Stress:   . Feeling of Stress :   Social Connections:   . Frequency of Communication with Friends and Family:   . Frequency of Social Gatherings with Friends and Family:   . Attends Religious Services:   . Active Member of Clubs or Organizations:   . Attends BankerClub or Organization Meetings:   Marland Kitchen. Marital Status:      Family History: Father had CHF  ROS:   Please see the history of present illness.    All other systems reviewed and are negative.  EKGs/Labs/Other Studies Reviewed:    The following studies were reviewed today:   EKG:  EKG is ordered today.  The ekg ordered today demonstrates sinus rhythm with PACs, rate 63, no ST/T abnormalities  TTE 03/19/19: 1. Left ventricular ejection fraction, by visual estimation, is 55 to  60%. The left ventricle has normal function. Left ventricular septal wall  thickness was mildly increased. Mildly increased left  ventricular  posterior wall thickness. There is mildly  increased left ventricular hypertrophy.  2. Left ventricular diastolic parameters are indeterminate.  3. The left ventricle has no regional wall motion abnormalities.  4. Global right ventricle has normal systolic function.The right  ventricular size is normal. No increase in right ventricular wall  thickness.  5. Left atrial size was severely dilated.  6. Right atrial size was moderately dilated.  7. The mitral valve is normal in structure. Trivial mitral valve  regurgitation. No evidence of mitral stenosis.  8. The tricuspid valve is normal in structure.  9. The aortic valve is normal in structure. Aortic valve regurgitation is  not visualized. No evidence of aortic valve sclerosis or stenosis.  10. Pulmonic regurgitation is mild.  11. The pulmonic valve was normal in  structure. Pulmonic valve  regurgitation is mild.  12. Normal pulmonary artery systolic pressure.  13. The inferior vena cava is normal in size with greater than 50%  respiratory variability, suggesting right atrial pressure of 3 mmHg.   Lexiscan Myoview 05/27/2019:  There was no ST segment deviation noted during stress.  Defect 1: There is a small defect of moderate severity present in the apical inferior location. It was present at rest and not at stress, consistent with artifact.  The study is normal.  This is a low risk study.  This study was not gated for systolic function due to frequent PACs and PVCs.     Cardiac monitor 05/22/19:  Total AF burden 1%. Longest AF episode lasted 2 hours 9 minutes, average HR 105bpm. Fastest episode with average rate 124 bpm.   Predominant rhythm is sinus rhythm. Range is 43 to 158 bpm bpm with average of 72 bpm. No sustained ventricular tachycardia, significant pause, or high degree AV block. Ventricular ectopy 1%. 9 patient triggered events, which corresponded to sinus rhythm +/- PACs. 4 beat run of NSVT.  Total  AF burden 1%.  Longest episode lasted 2 hours 9 minutes, average HR 105bpm.  Fastest episode with average rate 124 bpm.  Also with 2 episodes of atrial flutter with variable conduction, rate 70s.  Recent Labs: 03/17/2019: NT-Pro BNP 614 03/18/2019: ALT 15; TSH 1.383 03/22/2019: BUN 9; Creatinine, Ser 0.92; Potassium 3.7; Sodium 141 05/19/2019: Hemoglobin 13.9; Platelets 160  Recent Lipid Panel No results found for: CHOL, TRIG, HDL, CHOLHDL, VLDL, LDLCALC, LDLDIRECT  Physical Exam:    VS:  BP (!) 144/82   Pulse 63   Temp (!) 96.7 F (35.9 C)   Ht 6\' 1"  (1.854 m)   Wt 209 lb (94.8 kg)   SpO2 95%   BMI 27.57 kg/m     Wt Readings from Last 3 Encounters:  09/16/19 209 lb (94.8 kg)  06/18/19 200 lb 6.4 oz (90.9 kg)  05/27/19 202 lb (91.6 kg)     GEN:   in no acute distress HEENT: Normal NECK: No JVD LYMPHATICS: No lymphadenopathy CARDIAC: irregular, 2/6 systolic murmur RESPIRATORY:  Clear to auscultation without rales, wheezing or rhonchi  ABDOMEN: Soft, non-tender, non-distended MUSCULOSKELETAL:  1+ BLE edema SKIN: Warm and dry NEUROLOGIC:  Alert and oriented x 3 PSYCHIATRIC:  Normal affect   ASSESSMENT:    1. PAF (paroxysmal atrial fibrillation) (HCC)   2. Precordial pain   3. Other pulmonary embolism without acute cor pulmonale, unspecified chronicity (HCC)   4. Hyperlipidemia, unspecified hyperlipidemia type    PLAN:    Paroxysmal atrial fibrillation: CHA2DS2-VASc score 3 (agex2, DVT/PE).  Has not been on rate controlling agent given bradycardia while in the hospital.  30-day monitor showed AF burden 1%, with longest episode lasting 2 hours with average heart rate 105 bpm -Continue Eliquis 5 mg twice daily -Given episodes of RVR on monitor, started on low dose metoprolol 12.5 mg twice daily.  Appears to be tolerating well  PE/DVT: Diagnosed during hospitalization for anemia, started on Eliquis 5 mg twice daily as above.  No evidence of bleeding on Eliquis.  Chest  pain: describes dull aching pain in chest with exertiont.  Lexiscan Myoview shows no evidence of ischemia  Hyperlipidemia: On simvastatin 20 mg daily.  LDL 51 on 04/03/19  RTC in 6 months  Medication Adjustments/Labs and Tests Ordered: Current medicines are reviewed at length with the patient today.  Concerns regarding medicines are outlined above.  Orders  Placed This Encounter  Procedures  . EKG 12-Lead   No orders of the defined types were placed in this encounter.   Patient Instructions  Medication Instructions:  Your physician recommends that you continue on your current medications as directed. Please refer to the Current Medication list given to you today.   Follow-Up: At [facility], you and your health needs are our priority.  As part of our continuing mission to provide you with exceptional heart care, we have created designated Provider Care Teams.  These Care Teams include your primary Cardiologist (physician) and Advanced Practice Providers (APPs -  Physician Assistants and Nurse Practitioners) who all work together to provide you with the care you need, when you need it.  We recommend signing up for the patient portal called "MyChart".  Sign up information is provided on this After Visit Summary.  MyChart is used to connect with patients for Virtual Visits (Telemedicine).  Patients are able to view lab/test results, encounter notes, upcoming appointments, etc.  Non-urgent messages can be sent to your provider as well.   To learn more about what you can do with MyChart, go to [email or web address].    Your next appointment:   6 month(s)  The format for your next appointment:   In Person  Provider:   Epifanio Lesches, MD       Signed, Joshua Ishikawa, MD  09/18/2019 2:38 PM    [facility]

## 2019-10-20 ENCOUNTER — Telehealth: Payer: Self-pay | Admitting: *Deleted

## 2019-10-20 NOTE — Telephone Encounter (Signed)
° °  [facility] Pre-operative Risk Assessment    Request for surgical clearance:  1. What type of surgery is being performed? Dental extraction    2. When is this surgery scheduled? TBD-URGENT   3. What type of clearance is required (medical clearance vs. Pharmacy clearance to hold med vs. Both)? pharmacy  4. Are there any medications that need to be held prior to surgery and how long? Eliquis?   5. Practice name and name of physician performing surgery? [facility]   6. What is the office phone number? [phone]   7.   What is the office fax number? [phone]  8.   Anesthesia type (None, local, MAC, general) ? Local with epinephrine   Cerrone Debold A Cainen Burnham 10/20/2019, 4:40 PM  _________________________________________________________________   Hulen Skains office and left message to call back to clarify # of teeth to be extracted

## 2019-10-21 NOTE — Telephone Encounter (Signed)
° °  Primary Cardiologist: Little Ishikawa, MD  Chart reviewed as part of pre-operative protocol coverage. Simple dental extractions are considered low risk procedures per guidelines and generally do not require any specific cardiac clearance. It is also generally accepted that for simple extractions and dental cleanings, there is no need to interrupt blood thinner therapy.   SBE prophylaxis is not required for the patient.  I will route this recommendation to the requesting party via Epic fax function and remove from pre-op pool.  Please call with questions.  Ronney Asters, NP 10/21/2019, 11:07 AM

## 2019-10-21 NOTE — Telephone Encounter (Signed)
[employer] called to inform the office that it is only 1 tooth being extracted today. She also advised the patients appointment is today at 10am.

## 2019-12-02 DIAGNOSIS — D508 Other iron deficiency anemias: Secondary | ICD-10-CM | POA: Diagnosis not present

## 2019-12-02 DIAGNOSIS — E7800 Pure hypercholesterolemia, unspecified: Secondary | ICD-10-CM | POA: Diagnosis not present

## 2019-12-02 DIAGNOSIS — I480 Paroxysmal atrial fibrillation: Secondary | ICD-10-CM | POA: Diagnosis not present

## 2019-12-02 DIAGNOSIS — K219 Gastro-esophageal reflux disease without esophagitis: Secondary | ICD-10-CM | POA: Diagnosis not present

## 2020-02-04 DIAGNOSIS — H353122 Nonexudative age-related macular degeneration, left eye, intermediate dry stage: Secondary | ICD-10-CM | POA: Diagnosis not present

## 2020-02-04 DIAGNOSIS — H348312 Tributary (branch) retinal vein occlusion, right eye, stable: Secondary | ICD-10-CM | POA: Diagnosis not present

## 2020-02-04 DIAGNOSIS — H43813 Vitreous degeneration, bilateral: Secondary | ICD-10-CM | POA: Diagnosis not present

## 2020-03-13 NOTE — Progress Notes (Signed)
Cardiology Office Note:    Date:  03/16/2020   ID:  Joshua Estrada, DOB 30-May-1940, MRN [id number]  PCP:  Johny Blamer, MD  Cardiologist:  Little Ishikawa, MD  Electrophysiologist:  None   Referring MD: Johny Blamer, MD   Chief Complaint  Patient presents with  . Atrial Fibrillation    History of Present Illness:    Joshua Estrada is a 80 y.o. male with a hx of paroxysmal atrial fibrillation, DVT/PE severe anemia, HLD, BPH, GERD who presents for follow-up.  He was referred by Dr. Tiburcio Pea for evaluation of dyspnea on exertion, initially seen on 03/17/2019 .  As part of the work-up for his dyspnea, CBC was checked and found to have severe anemia (hemoglobin 4.2).  He was admitted to [facility] for further evaluation.  Work-up of his anemia revealed iron deficiency anemia thought to be due to chronic blood loss.  Underwent colonoscopy and EGD, thought that blood loss may be related to large hiatal hernia and he will benefit from iron supplementation and PPI use.  Incidentally during hospitalization was also found to have left lower extremity DVT and CTA positive for multiple PEs.  He was started on heparin drip and transitioned to Eliquis, with stable hemoglobin.  TTE was done during hospitalization, which showed EF 55 to 60%, mild LVH, indeterminate diastolic function, normal RV systolic function, severe left atrial dilatation, moderate right atrial dilatation, no significant valvular disease.  His hospital course was also complicated by development of atrial fibrillation with alternating bradycardia.  30-day monitor was ordered as outpatient and he was not started on AV nodal blocker given bradycardia.  30-day monitor showed total AF burden 1%, with longest AF episode lasting 2 hours with average heart rate 105 bpm.  He underwent a Lexiscan Myoview on 05/27/2019 which showed no perfusion defects.  Since last clinic visit, he reports that he has been doing well.  Denies any palpitations.  Reports  occasional dyspnea on exertion but denies any chest pain.  Denies any lightheadedness or syncope.  Reports occasional swelling in his feet.  Taking Eliquis, denies any bleeding issues.   Past Medical History:  Diagnosis Date  . BPH (benign prostatic hyperplasia)   . Hyperlipidemia     Past Surgical History:  Procedure Laterality Date  . BIOPSY  03/21/2019   Procedure: BIOPSY;  Surgeon: Kerin Salen, MD;  Location: [facility] ENDOSCOPY;  Service: Gastroenterology;;  . Cataract surgery    . COLONOSCOPY WITH PROPOFOL N/A 03/21/2019   Procedure: COLONOSCOPY WITH PROPOFOL;  Surgeon: Kerin Salen, MD;  Location: [facility] ENDOSCOPY;  Service: Gastroenterology;  Laterality: N/A;  . ESOPHAGOGASTRODUODENOSCOPY (EGD) WITH PROPOFOL N/A 03/21/2019   Procedure: ESOPHAGOGASTRODUODENOSCOPY (EGD) WITH PROPOFOL;  Surgeon: Kerin Salen, MD;  Location: [facility] ENDOSCOPY;  Service: Gastroenterology;  Laterality: N/A;  . POLYPECTOMY  03/21/2019   Procedure: POLYPECTOMY;  Surgeon: Kerin Salen, MD;  Location: [facility] ENDOSCOPY;  Service: Gastroenterology;;  . TONSILLECTOMY     As a child    Current Medications: Current Meds  Medication Sig  . ELIQUIS 5 MG TABS tablet Take 5 mg by mouth 2 (two) times daily.  . ferrous sulfate 325 (65 FE) MG tablet Take 1 tablet (325 mg total) by mouth 2 (two) times daily with a meal.  . metoprolol succinate (TOPROL XL) 25 MG 24 hr tablet Take 1 tablet (25 mg total) by mouth daily.  . Multiple Vitamins-Minerals (PRESERVISION AREDS 2 PO) Take 1 tablet by mouth 2 (two) times daily.   . pantoprazole (PROTONIX) 40 MG  tablet Take 1 tablet (40 mg total) by mouth daily.  . simvastatin (ZOCOR) 20 MG tablet Take 20 mg by mouth at bedtime.  . tamsulosin (FLOMAX) 0.4 MG CAPS capsule Take 0.4 mg by mouth at bedtime.   . vitamin B-12 1000 MCG tablet Take 1 tablet (1,000 mcg total) by mouth daily.  . [DISCONTINUED] metoprolol tartrate (LOPRESSOR) 25 MG tablet Take 0.5 tablets (12.5 mg total) by mouth 2 (two) times  daily.     Allergies:   Patient has no known allergies.   Social History   Socioeconomic History  . Marital status: Single    Spouse name: Not on file  . Number of children: Not on file  . Years of education: Not on file  . Highest education level: Not on file  Occupational History  . Not on file  Tobacco Use  . Smoking status: Never Smoker  . Smokeless tobacco: Never Used  Vaping Use  . Vaping Use: Never used  Substance and Sexual Activity  . Alcohol use: Never  . Drug use: Never  . Sexual activity: Not on file  Other Topics Concern  . Not on file  Social History Narrative  . Not on file   Social Determinants of Health   Financial Resource Strain: Not on file  Food Insecurity: Not on file  Transportation Needs: Not on file  Physical Activity: Not on file  Stress: Not on file  Social Connections: Not on file     Family History: Father had CHF  ROS:   Please see the history of present illness.    All other systems reviewed and are negative.  EKGs/Labs/Other Studies Reviewed:    The following studies were reviewed today:   EKG:  EKG is ordered today.  The ekg ordered today demonstrates sinus rhythm with PACs, rate 65, no ST/T abnormalities  TTE 03/19/19: 1. Left ventricular ejection fraction, by visual estimation, is 55 to  60%. The left ventricle has normal function. Left ventricular septal wall  thickness was mildly increased. Mildly increased left ventricular  posterior wall thickness. There is mildly  increased left ventricular hypertrophy.  2. Left ventricular diastolic parameters are indeterminate.  3. The left ventricle has no regional wall motion abnormalities.  4. Global right ventricle has normal systolic function.The right  ventricular size is normal. No increase in right ventricular wall  thickness.  5. Left atrial size was severely dilated.  6. Right atrial size was moderately dilated.  7. The mitral valve is normal in structure.  Trivial mitral valve  regurgitation. No evidence of mitral stenosis.  8. The tricuspid valve is normal in structure.  9. The aortic valve is normal in structure. Aortic valve regurgitation is  not visualized. No evidence of aortic valve sclerosis or stenosis.  10. Pulmonic regurgitation is mild.  11. The pulmonic valve was normal in structure. Pulmonic valve  regurgitation is mild.  12. Normal pulmonary artery systolic pressure.  13. The inferior vena cava is normal in size with greater than 50%  respiratory variability, suggesting right atrial pressure of 3 mmHg.   Lexiscan Myoview 05/27/2019:  There was no ST segment deviation noted during stress.  Defect 1: There is a small defect of moderate severity present in the apical inferior location. It was present at rest and not at stress, consistent with artifact.  The study is normal.  This is a low risk study.  This study was not gated for systolic function due to frequent PACs and PVCs.  Cardiac monitor 05/22/19:  Total AF burden 1%. Longest AF episode lasted 2 hours 9 minutes, average HR 105bpm. Fastest episode with average rate 124 bpm.   Predominant rhythm is sinus rhythm. Range is 43 to 158 bpm bpm with average of 72 bpm. No sustained ventricular tachycardia, significant pause, or high degree AV block. Ventricular ectopy 1%. 9 patient triggered events, which corresponded to sinus rhythm +/- PACs. 4 beat run of NSVT.  Total AF burden 1%.  Longest episode lasted 2 hours 9 minutes, average HR 105bpm.  Fastest episode with average rate 124 bpm.  Also with 2 episodes of atrial flutter with variable conduction, rate 70s.  Recent Labs: 03/17/2019: NT-Pro BNP 614 03/18/2019: ALT 15; TSH 1.383 03/22/2019: BUN 9; Creatinine, Ser 0.92; Potassium 3.7; Sodium 141 05/19/2019: Hemoglobin 13.9; Platelets 160  Recent Lipid Panel No results found for: CHOL, TRIG, HDL, CHOLHDL, VLDL, LDLCALC, LDLDIRECT  Physical Exam:    VS:  BP (!) 142/82    Pulse 65   Ht 6\' 1"  (1.854 m)   Wt 215 lb (97.5 kg)   SpO2 93%   BMI 28.37 kg/m     Wt Readings from Last 3 Encounters:  03/16/20 215 lb (97.5 kg)  09/16/19 209 lb (94.8 kg)  06/18/19 200 lb 6.4 oz (90.9 kg)     GEN:   in no acute distress HEENT: Normal NECK: No JVD LYMPHATICS: No lymphadenopathy CARDIAC: irregular, 2/6 systolic murmur RESPIRATORY:  Clear to auscultation without rales, wheezing or rhonchi  ABDOMEN: Soft, non-tender, non-distended MUSCULOSKELETAL:  1+ BLE edema SKIN: Warm and dry NEUROLOGIC:  Alert and oriented x 3 PSYCHIATRIC:  Normal affect   ASSESSMENT:    1. PAF (paroxysmal atrial fibrillation) (HCC)   2. Medication management   3. Hyperlipidemia, unspecified hyperlipidemia type   4. Chest pain of uncertain etiology   5. Other pulmonary embolism without acute cor pulmonale, unspecified chronicity (HCC)    PLAN:    Paroxysmal atrial fibrillation: CHA2DS2-VASc score 3 (agex2, DVT/PE).  Has not been on rate controlling agent given bradycardia while in the hospital.  30-day monitor showed AF burden 1%, with longest episode lasting 2 hours with average heart rate 105 bpm -Continue Eliquis 5 mg twice daily.  Will check CMP, CBC -Given episodes of RVR on monitor, started on low dose metoprolol 12.5 mg twice daily.  Will consolidate to Toprol-XL 25 mg daily  PE/DVT: Diagnosed during hospitalization for anemia, started on Eliquis 5 mg twice daily as above.  No evidence of bleeding on Eliquis.  Chest pain: describes dull aching pain in chest with exertiont.  Lexiscan Myoview shows no evidence of ischemia  Hyperlipidemia: On simvastatin 20 mg daily.  LDL 51 on 04/03/19.  Will check lipid panel  RTC in 6 months  Medication Adjustments/Labs and Tests Ordered: Current medicines are reviewed at length with the patient today.  Concerns regarding medicines are outlined above.  Orders Placed This Encounter  Procedures  . Comprehensive metabolic panel  . CBC  .  Lipid panel  . EKG 12-Lead   Meds ordered this encounter  Medications  . metoprolol succinate (TOPROL XL) 25 MG 24 hr tablet    Sig: Take 1 tablet (25 mg total) by mouth daily.    Dispense:  90 tablet    Refill:  3    STOP lopressor    Patient Instructions  Medication Instructions:  STOP metoprolol tartrate (Lopressor) START metoprolol succinate (Toprol XL) 25 mg once daily  *If you need a refill on your  cardiac medications before your next appointment, please call your pharmacy*   Lab Work: CMET, CBC, Lipid today  If you have labs (blood work) drawn today and your tests are completely normal, you will receive your results only by: Marland Kitchen. MyChart Message (if you have MyChart) OR . A paper copy in the mail If you have any lab test that is abnormal or we need to change your treatment, we will call you to review the results.  Follow-Up: At [facility], you and your health needs are our priority.  As part of our continuing mission to provide you with exceptional heart care, we have created designated Provider Care Teams.  These Care Teams include your primary Cardiologist (physician) and Advanced Practice Providers (APPs -  Physician Assistants and Nurse Practitioners) who all work together to provide you with the care you need, when you need it.  We recommend signing up for the patient portal called "MyChart".  Sign up information is provided on this After Visit Summary.  MyChart is used to connect with patients for Virtual Visits (Telemedicine).  Patients are able to view lab/test results, encounter notes, upcoming appointments, etc.  Non-urgent messages can be sent to your provider as well.   To learn more about what you can do with MyChart, go to [email or web address].    Your next appointment:   6 month(s)  The format for your next appointment:   In Person  Provider:   Epifanio Lescheshristopher Tramell Piechota, MD      Signed, Little Ishikawahristopher L Pal Shell, MD  03/16/2020 10:40 AM    [facility]

## 2020-03-16 ENCOUNTER — Ambulatory Visit: Payer: Federal, State, Local not specified - PPO | Admitting: Cardiology

## 2020-03-16 ENCOUNTER — Encounter: Payer: Self-pay | Admitting: Cardiology

## 2020-03-16 ENCOUNTER — Other Ambulatory Visit: Payer: Self-pay

## 2020-03-16 VITALS — BP 142/82 | HR 65 | Ht 73.0 in | Wt 215.0 lb

## 2020-03-16 DIAGNOSIS — Z79899 Other long term (current) drug therapy: Secondary | ICD-10-CM | POA: Diagnosis not present

## 2020-03-16 DIAGNOSIS — I2699 Other pulmonary embolism without acute cor pulmonale: Secondary | ICD-10-CM

## 2020-03-16 DIAGNOSIS — E785 Hyperlipidemia, unspecified: Secondary | ICD-10-CM

## 2020-03-16 DIAGNOSIS — R079 Chest pain, unspecified: Secondary | ICD-10-CM

## 2020-03-16 DIAGNOSIS — I480 Paroxysmal atrial fibrillation: Secondary | ICD-10-CM | POA: Diagnosis not present

## 2020-03-16 LAB — COMPREHENSIVE METABOLIC PANEL
ALT: 17 IU/L (ref 0–44)
AST: 15 IU/L (ref 0–40)
Albumin/Globulin Ratio: 2.3 — ABNORMAL HIGH (ref 1.2–2.2)
Albumin: 4.6 g/dL (ref 3.7–4.7)
Alkaline Phosphatase: 91 IU/L (ref 44–121)
BUN/Creatinine Ratio: 24 (ref 10–24)
BUN: 19 mg/dL (ref 8–27)
Bilirubin Total: 0.6 mg/dL (ref 0.0–1.2)
CO2: 22 mmol/L (ref 20–29)
Calcium: 9.3 mg/dL (ref 8.6–10.2)
Chloride: 105 mmol/L (ref 96–106)
Creatinine, Ser: 0.79 mg/dL (ref 0.76–1.27)
GFR calc Af Amer: 100 mL/min/{1.73_m2} (ref >59)
GFR calc non Af Amer: 87 mL/min/{1.73_m2} (ref >59)
Globulin, Total: 2 g/dL (ref 1.5–4.5)
Glucose: 91 mg/dL (ref 65–99)
Potassium: 4.2 mmol/L (ref 3.5–5.2)
Sodium: 141 mmol/L (ref 134–144)
Total Protein: 6.6 g/dL (ref 6.0–8.5)

## 2020-03-16 LAB — CBC
Hematocrit: 43.5 % (ref 37.5–51.0)
Hemoglobin: 15 g/dL (ref 13.0–17.7)
MCH: 33.5 pg — ABNORMAL HIGH (ref 26.6–33.0)
MCHC: 34.5 g/dL (ref 31.5–35.7)
MCV: 97 fL (ref 79–97)
Platelets: 194 10*3/uL (ref 150–450)
RBC: 4.48 x10E6/uL (ref 4.14–5.80)
RDW: 11.9 % (ref 11.6–15.4)
WBC: 4.9 10*3/uL (ref 3.4–10.8)

## 2020-03-16 LAB — LIPID PANEL
Chol/HDL Ratio: 2.8 ratio (ref 0.0–5.0)
Cholesterol, Total: 128 mg/dL (ref 100–199)
HDL: 45 mg/dL (ref >39)
LDL Chol Calc (NIH): 69 mg/dL (ref 0–99)
Triglycerides: 70 mg/dL (ref 0–149)
VLDL Cholesterol Cal: 14 mg/dL (ref 5–40)

## 2020-03-16 MED ORDER — METOPROLOL SUCCINATE ER 25 MG PO TB24: 25.0000 mg | ORAL_TABLET | Freq: Every day | ORAL | 3 refills | Status: DC

## 2020-03-16 NOTE — Patient Instructions (Signed)
Medication Instructions:  STOP metoprolol tartrate (Lopressor) START metoprolol succinate (Toprol XL) 25 mg once daily  *If you need a refill on your cardiac medications before your next appointment, please call your pharmacy*   Lab Work: CMET, CBC, Lipid today  If you have labs (blood work) drawn today and your tests are completely normal, you will receive your results only by: Marland Kitchen MyChart Message (if you have MyChart) OR . A paper copy in the mail If you have any lab test that is abnormal or we need to change your treatment, we will call you to review the results.  Follow-Up: At [facility], you and your health needs are our priority.  As part of our continuing mission to provide you with exceptional heart care, we have created designated Provider Care Teams.  These Care Teams include your primary Cardiologist (physician) and Advanced Practice Providers (APPs -  Physician Assistants and Nurse Practitioners) who all work together to provide you with the care you need, when you need it.  We recommend signing up for the patient portal called "MyChart".  Sign up information is provided on this After Visit Summary.  MyChart is used to connect with patients for Virtual Visits (Telemedicine).  Patients are able to view lab/test results, encounter notes, upcoming appointments, etc.  Non-urgent messages can be sent to your provider as well.   To learn more about what you can do with MyChart, go to [email or web address].    Your next appointment:   6 month(s)  The format for your next appointment:   In Person  Provider:   Epifanio Lesches, MD

## 2020-03-23 ENCOUNTER — Encounter: Payer: Self-pay | Admitting: *Deleted

## 2020-06-03 DIAGNOSIS — K219 Gastro-esophageal reflux disease without esophagitis: Secondary | ICD-10-CM | POA: Diagnosis not present

## 2020-06-03 DIAGNOSIS — I10 Essential (primary) hypertension: Secondary | ICD-10-CM | POA: Diagnosis not present

## 2020-06-03 DIAGNOSIS — E7800 Pure hypercholesterolemia, unspecified: Secondary | ICD-10-CM | POA: Diagnosis not present

## 2020-06-03 DIAGNOSIS — N401 Enlarged prostate with lower urinary tract symptoms: Secondary | ICD-10-CM | POA: Diagnosis not present

## 2020-08-04 DIAGNOSIS — H43813 Vitreous degeneration, bilateral: Secondary | ICD-10-CM | POA: Diagnosis not present

## 2020-08-04 DIAGNOSIS — H353132 Nonexudative age-related macular degeneration, bilateral, intermediate dry stage: Secondary | ICD-10-CM | POA: Diagnosis not present

## 2020-08-04 DIAGNOSIS — H31091 Other chorioretinal scars, right eye: Secondary | ICD-10-CM | POA: Diagnosis not present

## 2020-08-04 DIAGNOSIS — H348312 Tributary (branch) retinal vein occlusion, right eye, stable: Secondary | ICD-10-CM | POA: Diagnosis not present

## 2020-08-04 DIAGNOSIS — H35372 Puckering of macula, left eye: Secondary | ICD-10-CM | POA: Diagnosis not present

## 2020-08-27 DIAGNOSIS — R059 Cough, unspecified: Secondary | ICD-10-CM | POA: Diagnosis not present

## 2020-08-27 DIAGNOSIS — Z20822 Contact with and (suspected) exposure to covid-19: Secondary | ICD-10-CM | POA: Diagnosis not present

## 2020-08-27 DIAGNOSIS — U071 COVID-19: Secondary | ICD-10-CM | POA: Diagnosis not present

## 2020-08-27 DIAGNOSIS — J069 Acute upper respiratory infection, unspecified: Secondary | ICD-10-CM | POA: Diagnosis not present

## 2020-09-14 DIAGNOSIS — H353211 Exudative age-related macular degeneration, right eye, with active choroidal neovascularization: Secondary | ICD-10-CM | POA: Diagnosis not present

## 2020-10-25 ENCOUNTER — Other Ambulatory Visit: Payer: Self-pay

## 2020-10-25 ENCOUNTER — Ambulatory Visit: Payer: Federal, State, Local not specified - PPO | Admitting: Cardiology

## 2020-10-25 VITALS — BP 122/72 | HR 53 | Ht 73.0 in | Wt 210.4 lb

## 2020-10-25 DIAGNOSIS — I480 Paroxysmal atrial fibrillation: Secondary | ICD-10-CM | POA: Diagnosis not present

## 2020-10-25 DIAGNOSIS — R079 Chest pain, unspecified: Secondary | ICD-10-CM | POA: Diagnosis not present

## 2020-10-25 DIAGNOSIS — D649 Anemia, unspecified: Secondary | ICD-10-CM

## 2020-10-25 DIAGNOSIS — I2699 Other pulmonary embolism without acute cor pulmonale: Secondary | ICD-10-CM

## 2020-10-25 DIAGNOSIS — E785 Hyperlipidemia, unspecified: Secondary | ICD-10-CM

## 2020-10-25 NOTE — Progress Notes (Signed)
Cardiology Office Note:    Date:  10/25/2020   ID:  Joshua Estrada, DOB 09-Jan-1941, MRN [id number]  PCP:  Johny BlamerHarris, William, MD  Cardiologist:  Little Ishikawahristopher L Thor Nannini, MD  Electrophysiologist:  None   Referring MD: Johny BlamerHarris, William, MD   Chief Complaint  Patient presents with   Atrial Fibrillation          History of Present Illness:    Joshua Estrada is a 80 y.o. male with a hx of paroxysmal atrial fibrillation, DVT/PE severe anemia, HLD, BPH, GERD who presents for follow-up.  He was referred by Dr. Tiburcio PeaHarris for evaluation of dyspnea on exertion, initially seen on 03/17/2019 .  As part of the work-up for his dyspnea, CBC was checked and found to have severe anemia (hemoglobin 4.2).  [facility].  Work-up of his anemia revealed iron deficiency anemia thought to be due to chronic blood loss.  Underwent colonoscopy and EGD, thought that blood loss may be related to large hiatal hernia and he will benefit from iron supplementation and PPI use.  Incidentally during hospitalization was also found to have left lower extremity DVT and CTA positive for multiple PEs.  He was started on heparin drip and transitioned to Eliquis, with stable hemoglobin.  TTE was done during hospitalization, which showed EF 55 to 60%, mild LVH, indeterminate diastolic function, normal RV systolic function, severe left atrial dilatation, moderate right atrial dilatation, no significant valvular disease.  His hospital course was also complicated by development of atrial fibrillation with alternating bradycardia.  30-day monitor was ordered as outpatient and he was not started on AV nodal blocker given bradycardia.  30-day monitor showed total AF burden 1%, with longest AF episode lasting 2 hours with average heart rate 105 bpm.  He underwent a Lexiscan Myoview on 05/27/2019 which showed no perfusion defects.  Since last clinic visit, he reports that he has been doing well.  Reports occasional dyspnea,  denies any chest pain.  Denies any lightheadedness, syncope, palpitations, or edema.  He is taking Eliquis, denies any bleeding issues.  Reports he has not been exercising.   Past Medical History:  Diagnosis Date   BPH (benign prostatic hyperplasia)    Hyperlipidemia     Past Surgical History:  Procedure Laterality Date   BIOPSY  03/21/2019   Procedure: BIOPSY;  Surgeon: Kerin SalenKarki, Arya, MD;  [facility];  Service: Gastroenterology;;   Cataract surgery     COLONOSCOPY WITH PROPOFOL N/A 03/21/2019   Procedure: COLONOSCOPY WITH PROPOFOL;  Surgeon: Kerin SalenKarki, Arya, MD;  [facility];  Service: Gastroenterology;  Laterality: N/A;   ESOPHAGOGASTRODUODENOSCOPY (EGD) WITH PROPOFOL N/A 03/21/2019   Procedure: ESOPHAGOGASTRODUODENOSCOPY (EGD) WITH PROPOFOL;  Surgeon: Kerin SalenKarki, Arya, MD;  [facility];  Service: Gastroenterology;  Laterality: N/A;   POLYPECTOMY  03/21/2019   Procedure: POLYPECTOMY;  Surgeon: Kerin SalenKarki, Arya, MD;  [facility];  Service: Gastroenterology;;   TONSILLECTOMY     As a child    Current Medications: Current Meds  Medication Sig   ELIQUIS 5 MG TABS tablet Take 5 mg by mouth 2 (two) times daily.   ferrous sulfate 325 (65 FE) MG tablet Take 1 tablet (325 mg total) by mouth 2 (two) times daily with a meal.   metoprolol succinate (TOPROL XL) 25 MG 24 hr tablet Take 1 tablet (25 mg total) by mouth daily.   Multiple Vitamins-Minerals (PRESERVISION AREDS 2 PO) Take 1 tablet by mouth 2 (two) times daily.    pantoprazole (  PROTONIX) 40 MG tablet Take 1 tablet (40 mg total) by mouth daily.   simvastatin (ZOCOR) 20 MG tablet Take 20 mg by mouth at bedtime.   tamsulosin (FLOMAX) 0.4 MG CAPS capsule Take 0.4 mg by mouth at bedtime.    vitamin B-12 1000 MCG tablet Take 1 tablet (1,000 mcg total) by mouth daily.     Allergies:   Patient has no known allergies.   Social History   Socioeconomic History   Marital status: Single    Spouse name: Not on file    Number of children: Not on file   Years of education: Not on file   Highest education level: Not on file  Occupational History   Not on file  Tobacco Use   Smoking status: Never   Smokeless tobacco: Never  Vaping Use   Vaping Use: Never used  Substance and Sexual Activity   Alcohol use: Never   Drug use: Never   Sexual activity: Not on file  Other Topics Concern   Not on file  Social History Narrative   Not on file   Social Determinants of Health   Financial Resource Strain: Not on file  Food Insecurity: Not on file  Transportation Needs: Not on file  Physical Activity: Not on file  Stress: Not on file  Social Connections: Not on file     Family History: Father had CHF  ROS:   Please see the history of present illness.    All other systems reviewed and are negative.  EKGs/Labs/Other Studies Reviewed:    The following studies were reviewed today:   EKG:  EKG is ordered today.  The ekg ordered today demonstrates sinus rhythm, rate 53, diffuse less than 1 mm ST depressions   TTE 03/19/19:  1. Left ventricular ejection fraction, by visual estimation, is 55 to  60%. The left ventricle has normal function. Left ventricular septal wall  thickness was mildly increased. Mildly increased left ventricular  posterior wall thickness. There is mildly  increased left ventricular hypertrophy.   2. Left ventricular diastolic parameters are indeterminate.   3. The left ventricle has no regional wall motion abnormalities.   4. Global right ventricle has normal systolic function.The right  ventricular size is normal. No increase in right ventricular wall  thickness.   5. Left atrial size was severely dilated.   6. Right atrial size was moderately dilated.   7. The mitral valve is normal in structure. Trivial mitral valve  regurgitation. No evidence of mitral stenosis.   8. The tricuspid valve is normal in structure.   9. The aortic valve is normal in structure. Aortic valve  regurgitation is  not visualized. No evidence of aortic valve sclerosis or stenosis.  10. Pulmonic regurgitation is mild.  11. The pulmonic valve was normal in structure. Pulmonic valve  regurgitation is mild.  12. Normal pulmonary artery systolic pressure.  13. The inferior vena cava is normal in size with greater than 50%  respiratory variability, suggesting right atrial pressure of 3 mmHg.   Lexiscan Myoview 05/27/2019: There was no ST segment deviation noted during stress. Defect 1: There is a small defect of moderate severity present in the apical inferior location. It was present at rest and not at stress, consistent with artifact. The study is normal. This is a low risk study. This study was not gated for systolic function due to frequent PACs and PVCs.     Cardiac monitor 05/22/19: Total AF burden 1%. Longest AF episode lasted 2  hours 9 minutes, average HR 105bpm. Fastest episode with average rate 124 bpm.   Predominant rhythm is sinus rhythm. Range is 43 to 158 bpm bpm with average of 72 bpm. No sustained ventricular tachycardia, significant pause, or high degree AV block. Ventricular ectopy 1%. 9 patient triggered events, which corresponded to sinus rhythm +/- PACs. 4 beat run of NSVT.  Total AF burden 1%.  Longest episode lasted 2 hours 9 minutes, average HR 105bpm.  Fastest episode with average rate 124 bpm.  Also with 2 episodes of atrial flutter with variable conduction, rate 70s.  Recent Labs: 03/16/2020: ALT 17 10/25/2020: BUN WILL FOLLOW; Creatinine, Ser WILL FOLLOW; Hemoglobin 14.8; Platelets 172; Potassium WILL FOLLOW; Sodium WILL FOLLOW  Recent Lipid Panel    Component Value Date/Time   CHOL 128 03/16/2020 1050   TRIG 70 03/16/2020 1050   HDL 45 03/16/2020 1050   CHOLHDL 2.8 03/16/2020 1050   LDLCALC 69 03/16/2020 1050    Physical Exam:    VS:  BP 122/72   Pulse (!) 53   Ht 6\' 1"  (1.854 m)   Wt 210 lb 6.4 oz (95.4 kg)   SpO2 96%   BMI 27.76 kg/m     Wt  Readings from Last 3 Encounters:  10/25/20 210 lb 6.4 oz (95.4 kg)  03/16/20 215 lb (97.5 kg)  09/16/19 209 lb (94.8 kg)     GEN:   in no acute distress HEENT: Normal NECK: No JVD LYMPHATICS: No lymphadenopathy CARDIAC: RRR, 2/6 systolic murmur RESPIRATORY:  Clear to auscultation without rales, wheezing or rhonchi  ABDOMEN: Soft, non-tender, non-distended MUSCULOSKELETAL: No edema SKIN: Warm and dry NEUROLOGIC:  Alert and oriented x 3 PSYCHIATRIC:  Normal affect   ASSESSMENT:    1. PAF (paroxysmal atrial fibrillation) (HCC)   2. Anemia, unspecified type   3. Pulmonary embolism without acute cor pulmonale, unspecified chronicity, unspecified pulmonary embolism type (HCC)   4. Chest pain of uncertain etiology   5. Hyperlipidemia, unspecified hyperlipidemia type     PLAN:    Paroxysmal atrial fibrillation: CHA2DS2-VASc score 3 (agex2, DVT/PE).  30-day monitor showed AF burden 1%, with longest episode lasting 2 hours with average heart rate 105 bpm.  Echo showed EF 55 to 60%, mild LVH, indeterminate diastolic function, normal RV systolic function, severe left atrial dilatation, moderate right atrial dilatation, no significant valvular disease -Continue Eliquis 5 mg twice daily.  Will check BMP, CBC -Given episodes of RVR on monitor, started on low dose metoprolol 12.5 mg twice daily.  Will consolidate to Toprol-XL 25 mg daily  PE/DVT: Diagnosed during hospitalization for anemia, started on Eliquis 5 mg twice daily as above.  No evidence of bleeding on Eliquis.  Chest pain: describes dull aching pain in chest with exertion.  Lexiscan Myoview shows no evidence of ischemia.  Reports chest pain has improved  Hyperlipidemia: On simvastatin 20 mg daily.  LDL 69 on 03/16/2020  RTC in 6 months  Medication Adjustments/Labs and Tests Ordered: Current medicines are reviewed at length with the patient today.  Concerns regarding medicines are outlined above.  Orders Placed This Encounter   Procedures   Basic metabolic panel   CBC   EKG 12-Lead    No orders of the defined types were placed in this encounter.   Patient Instructions  Medication Instructions:  Your physician recommends that you continue on your current medications as directed. Please refer to the Current Medication list given to you today.  *If you need a refill on your  cardiac medications before your next appointment, please call your pharmacy*  Lab Work: BMET, CBC today  If you have labs (blood work) drawn today and your tests are completely normal, you will receive your results only by: MyChart Message (if you have MyChart) OR A paper copy in the mail If you have any lab test that is abnormal or we need to change your treatment, we will call you to review the results.  Follow-Up: At [facility], you and your health needs are our priority.  As part of our continuing mission to provide you with exceptional heart care, we have created designated Provider Care Teams.  These Care Teams include your primary Cardiologist (physician) and Advanced Practice Providers (APPs -  Physician Assistants and Nurse Practitioners) who all work together to provide you with the care you need, when you need it.  We recommend signing up for the patient portal called "MyChart".  Sign up information is provided on this After Visit Summary.  MyChart is used to connect with patients for Virtual Visits (Telemedicine).  Patients are able to view lab/test results, encounter notes, upcoming appointments, etc.  Non-urgent messages can be sent to your provider as well.   To learn more about what you can do with MyChart, go to [email or web address].    Your next appointment:   6 month(s)  The format for your next appointment:   In Person  Provider:   Epifanio Lesches, MD      Signed, Little Ishikawa, MD  10/25/2020 11:06 PM    [facility]

## 2020-10-25 NOTE — Patient Instructions (Signed)
Medication Instructions:  Your physician recommends that you continue on your current medications as directed. Please refer to the Current Medication list given to you today.  *If you need a refill on your cardiac medications before your next appointment, please call your pharmacy*  Lab Work: BMET, CBC today  If you have labs (blood work) drawn today and your tests are completely normal, you will receive your results only by: MyChart Message (if you have MyChart) OR A paper copy in the mail If you have any lab test that is abnormal or we need to change your treatment, we will call you to review the results.  Follow-Up: At [facility], you and your health needs are our priority.  As part of our continuing mission to provide you with exceptional heart care, we have created designated Provider Care Teams.  These Care Teams include your primary Cardiologist (physician) and Advanced Practice Providers (APPs -  Physician Assistants and Nurse Practitioners) who all work together to provide you with the care you need, when you need it.  We recommend signing up for the patient portal called "MyChart".  Sign up information is provided on this After Visit Summary.  MyChart is used to connect with patients for Virtual Visits (Telemedicine).  Patients are able to view lab/test results, encounter notes, upcoming appointments, etc.  Non-urgent messages can be sent to your provider as well.   To learn more about what you can do with MyChart, go to [email or web address].    Your next appointment:   6 month(s)  The format for your next appointment:   In Person  Provider:   Epifanio Lesches, MD

## 2020-10-26 ENCOUNTER — Encounter: Payer: Self-pay | Admitting: *Deleted

## 2020-10-26 LAB — BASIC METABOLIC PANEL
BUN/Creatinine Ratio: 23 (ref 10–24)
BUN: 19 mg/dL (ref 8–27)
CO2: 26 mmol/L (ref 20–29)
Calcium: 9.4 mg/dL (ref 8.6–10.2)
Chloride: 107 mmol/L — ABNORMAL HIGH (ref 96–106)
Creatinine, Ser: 0.83 mg/dL (ref 0.76–1.27)
Glucose: 91 mg/dL (ref 65–99)
Potassium: 4.2 mmol/L (ref 3.5–5.2)
Sodium: 143 mmol/L (ref 134–144)
eGFR: 90 mL/min/{1.73_m2} (ref >59)

## 2020-10-26 LAB — CBC
Hematocrit: 44.4 % (ref 37.5–51.0)
Hemoglobin: 14.8 g/dL (ref 13.0–17.7)
MCH: 31.9 pg (ref 26.6–33.0)
MCHC: 33.3 g/dL (ref 31.5–35.7)
MCV: 96 fL (ref 79–97)
Platelets: 172 10*3/uL (ref 150–450)
RBC: 4.64 x10E6/uL (ref 4.14–5.80)
RDW: 12.7 % (ref 11.6–15.4)
WBC: 5.3 10*3/uL (ref 3.4–10.8)

## 2020-11-01 ENCOUNTER — Telehealth: Payer: Self-pay

## 2020-11-01 NOTE — Telephone Encounter (Signed)
I s/w DDS office and confirmed pt will be having only 1 tooth removed at this time.

## 2020-11-01 NOTE — Telephone Encounter (Signed)
   [facility] Pre-operative Risk Assessment    Patient Name: Joshua Estrada  DOB: [birth year] MRN: [id number]  HEARTCARE STAFF:  - IMPORTANT!!!!!! Under Visit Info/Reason for Call, type in Other and utilize the format Clearance MM/DD/YY or Clearance TBD. Do not use dashes or single digits. - Please review there is not already an duplicate clearance open for this procedure. - If request is for dental extraction, please clarify the # of teeth to be extracted. - If the patient is currently at the dentist's office, call Pre-Op Callback Staff (MA/nurse) to input urgent request.  - If the patient is not currently in the dentist office, please route to the Pre-Op pool.  Request for surgical clearance:  What type of surgery is being performed? Extractions  When is this surgery scheduled? TBD  What type of clearance is required (medical clearance vs. Pharmacy clearance to hold med vs. Both)? Medical   Are there any medications that need to be held prior to surgery and how long? Eliquis   Practice name and name of physician performing surgery? [facility]   What is the office phone number? [phone]   7.   What is the office fax number? [phone]  8.   Anesthesia type (None, local, MAC, general) ? Unknown    Joshua Estrada 11/01/2020, 9:54 AM  _________________________________________________________________   (provider comments below)

## 2020-11-01 NOTE — Telephone Encounter (Signed)
   Patient Name: Joshua Estrada  DOB: [birth year] MRN: [id number]  Primary Cardiologist: Little Ishikawa, MD  Chart reviewed as part of pre-operative protocol coverage. Patient was recently seen by Dr. Bjorn Pippin on 10/25/2020 and was doing well from a cardiac standpoint. Simple dental extractions are considered low risk procedures per guidelines and generally do not require any specific cardiac clearance. It is also generally accepted that for simple extractions and dental cleanings, there is no need to interrupt blood thinner therapy.  SBE prophylaxis is not required for the patient from a cardiac standpoint.  I will route this recommendation to the requesting party via Epic fax function and remove from pre-op pool.  Please call with questions.  Corrin Parker, PA-C 11/01/2020, 10:38 AM

## 2020-11-01 NOTE — Telephone Encounter (Signed)
Pre-op covering staff, can we please find out how many extractions he is going to be having?  Thank you!

## 2020-11-04 NOTE — Telephone Encounter (Signed)
Joshua Estrada is calling stating he is having excessive bleeding from having this tooth pulled. He called the dentist and they advised him to reach out to our office to be advised on it. The tooth was pulled yesterday afternoon. The bleeding has slowed down this morning, but has not stopped. Please advise.

## 2020-11-04 NOTE — Telephone Encounter (Signed)
Contacted patient to discuss bleeding post tooth extraction.  He reports that he has been holding his Eliquis for the past day.  He reports his bleeding is slowing.  I recommended continued pressure on the extraction site and that he resume his Eliquis.  He expressed understanding.  Joshua Estrada. Keilon Ressel NP-C    11/04/2020, 10:51 AM [facility] [address] Office [phone] Fax [phone]

## 2020-12-05 DIAGNOSIS — K219 Gastro-esophageal reflux disease without esophagitis: Secondary | ICD-10-CM | POA: Diagnosis not present

## 2020-12-05 DIAGNOSIS — I480 Paroxysmal atrial fibrillation: Secondary | ICD-10-CM | POA: Diagnosis not present

## 2020-12-05 DIAGNOSIS — Z86711 Personal history of pulmonary embolism: Secondary | ICD-10-CM | POA: Diagnosis not present

## 2020-12-05 DIAGNOSIS — I10 Essential (primary) hypertension: Secondary | ICD-10-CM | POA: Diagnosis not present

## 2021-02-02 DIAGNOSIS — H348312 Tributary (branch) retinal vein occlusion, right eye, stable: Secondary | ICD-10-CM | POA: Diagnosis not present

## 2021-02-02 DIAGNOSIS — H353132 Nonexudative age-related macular degeneration, bilateral, intermediate dry stage: Secondary | ICD-10-CM | POA: Diagnosis not present

## 2021-02-02 DIAGNOSIS — H43813 Vitreous degeneration, bilateral: Secondary | ICD-10-CM | POA: Diagnosis not present

## 2021-02-02 DIAGNOSIS — H35372 Puckering of macula, left eye: Secondary | ICD-10-CM | POA: Diagnosis not present

## 2021-04-17 DIAGNOSIS — E7800 Pure hypercholesterolemia, unspecified: Secondary | ICD-10-CM | POA: Diagnosis not present

## 2021-04-17 DIAGNOSIS — R600 Localized edema: Secondary | ICD-10-CM | POA: Diagnosis not present

## 2021-04-17 DIAGNOSIS — D508 Other iron deficiency anemias: Secondary | ICD-10-CM | POA: Diagnosis not present

## 2021-04-17 DIAGNOSIS — L739 Follicular disorder, unspecified: Secondary | ICD-10-CM | POA: Diagnosis not present

## 2021-04-30 IMAGING — CR DG CHEST 2V
2 series · 2 of 2 positions shown · non-contrast
Comparison: None.

CLINICAL DATA: Shortness of breath

EXAM:
CHEST - 2 VIEW

[w chest pa]
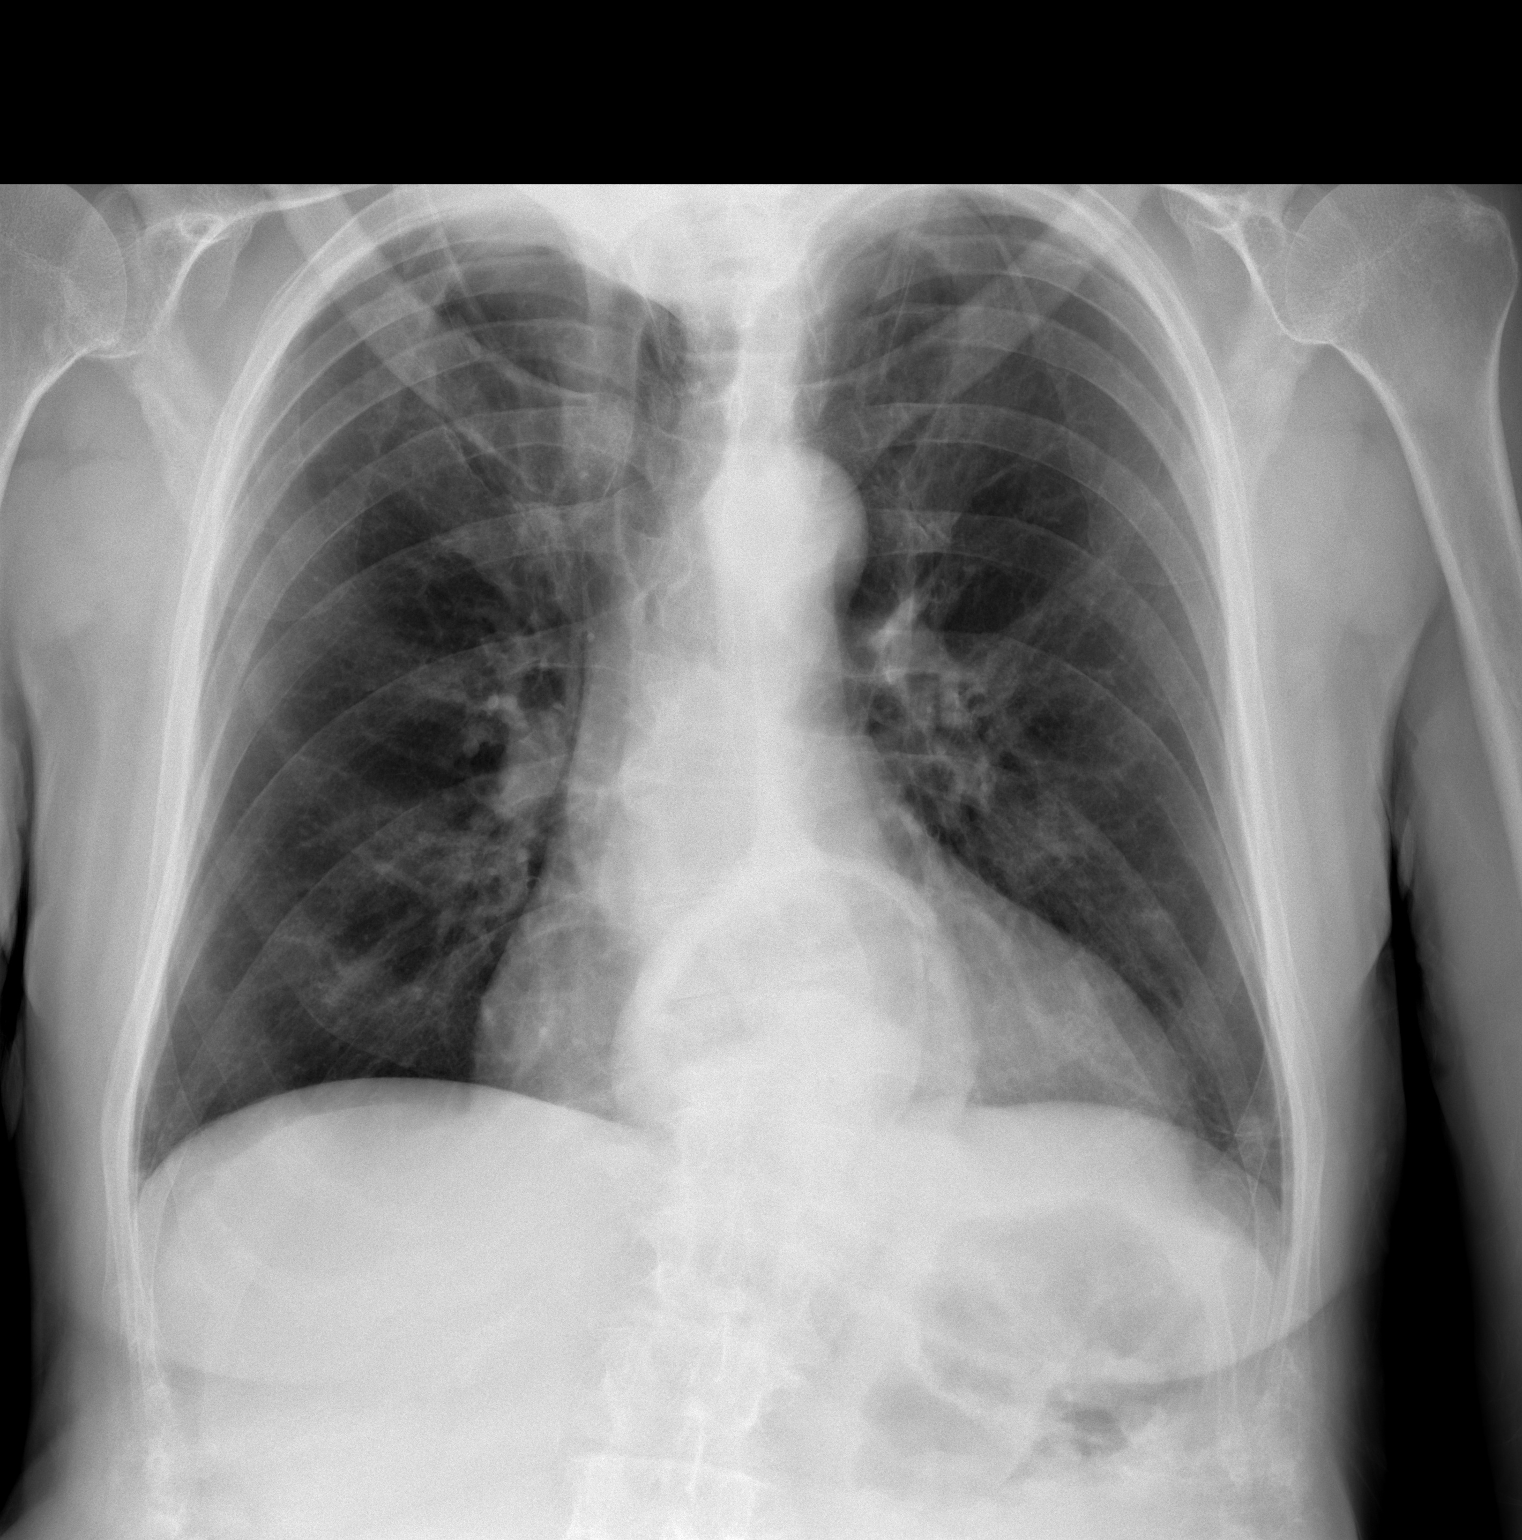

[w chest lat]
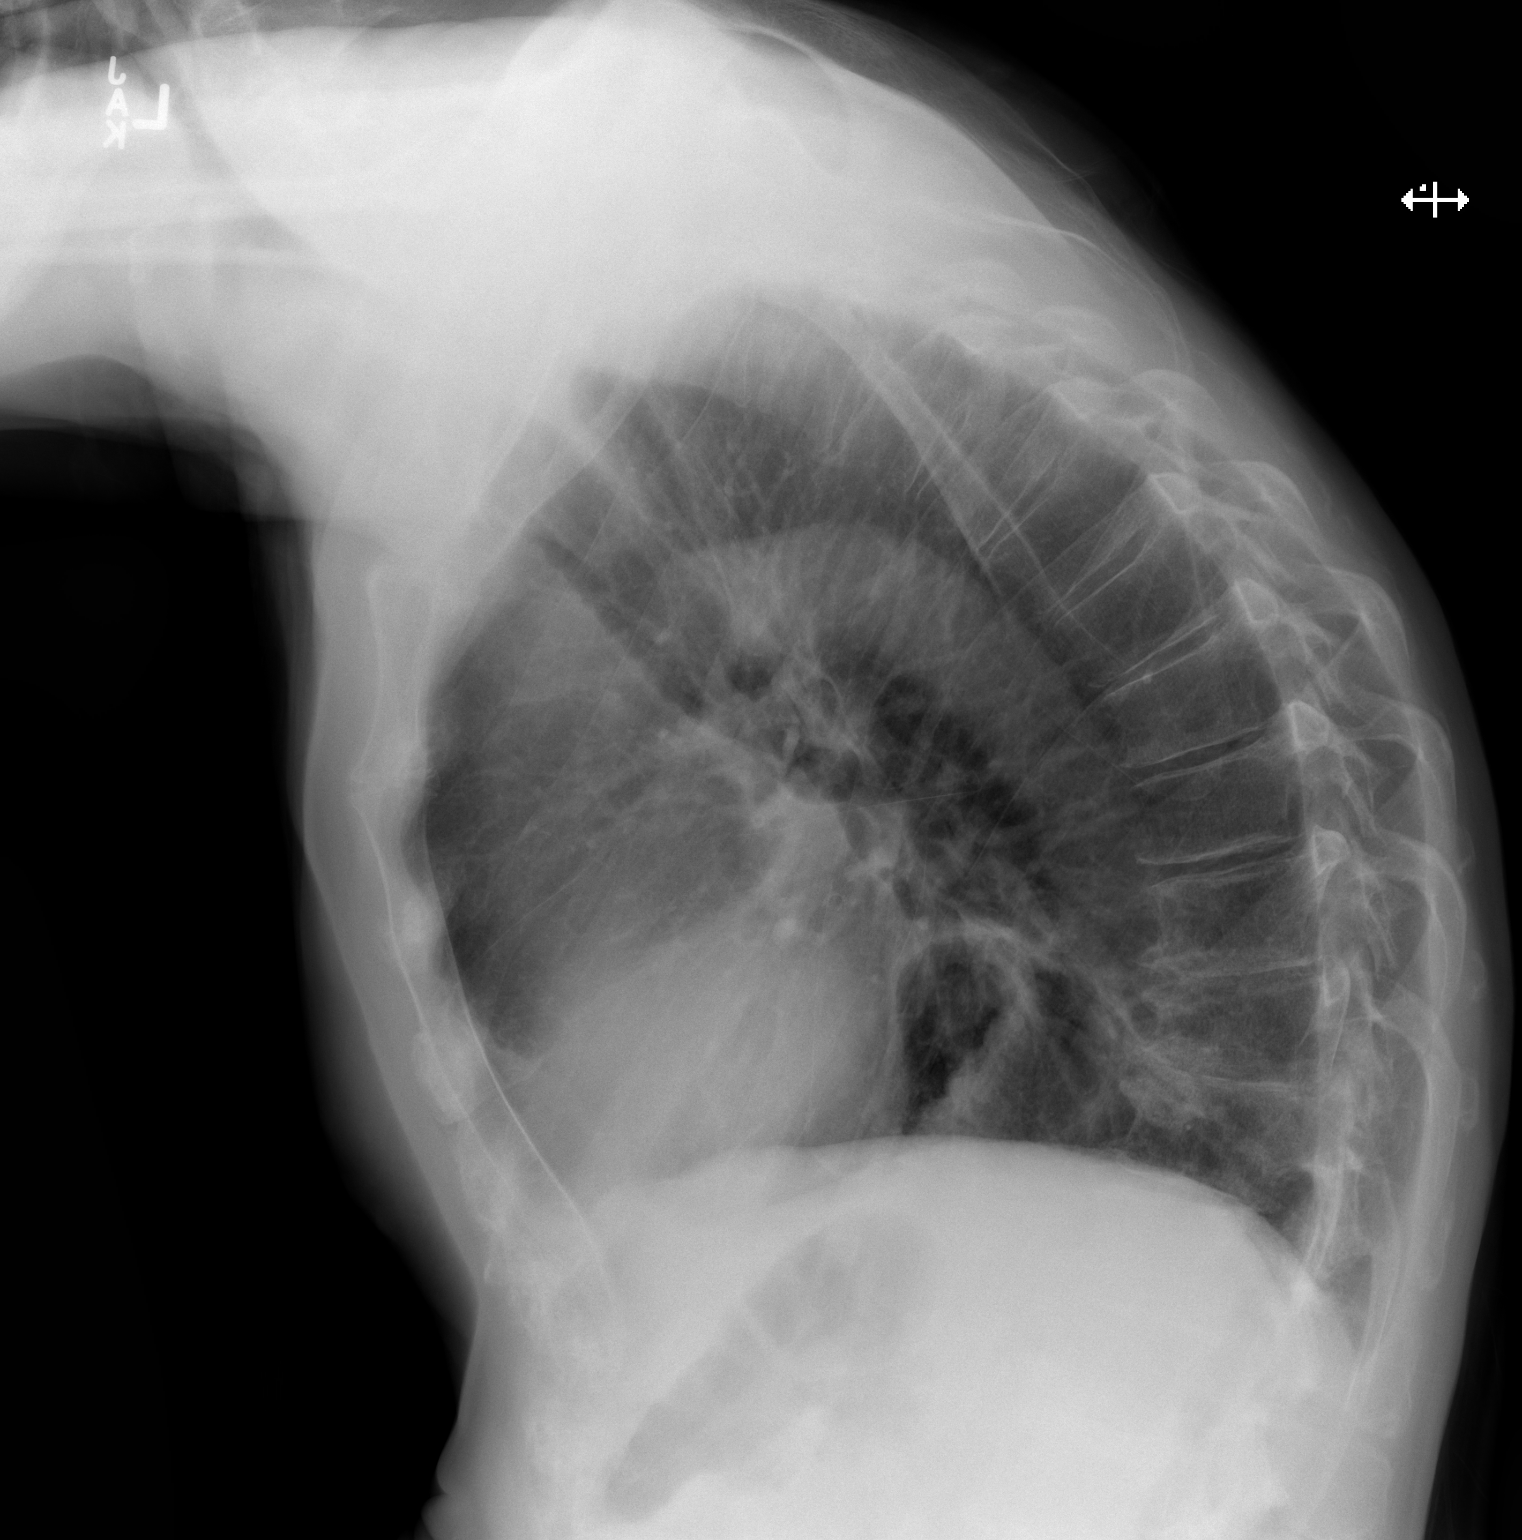

[2 of 2 positions shown; findings below may reference images not displayed]

FINDINGS: Cardiomegaly. Hiatal hernia. Both lungs are clear. Disc degenerative
disease of the thoracic spine with exaggerated thoracic kyphosis.
IMPRESSION: 1.  No acute abnormality of the lungs.

2.  Cardiomegaly.

3.  Hiatal hernia.

## 2021-05-07 NOTE — Progress Notes (Signed)
Cardiology Office Note:    Date:  05/11/2021   ID:  Joshua Estrada, DOB 1940-09-21, MRN [id number]  PCP:  Shirline Frees, MD  Cardiologist:  Donato Heinz, MD  Electrophysiologist:  None   Referring MD: Shirline Frees, MD   Chief Complaint  Patient presents with   Atrial Fibrillation    History of Present Illness:    Joshua Estrada is a 81 y.o. male with a hx of paroxysmal atrial fibrillation, DVT/PE severe anemia, HLD, BPH, GERD who presents for follow-up.  He was referred by Dr. [name] for evaluation of dyspnea on exertion, initially seen on 03/17/2019 .  As part of the work-up for his dyspnea, CBC was checked and found to have severe anemia (hemoglobin 4.2).  He was admitted to [facility] for further evaluation.  Work-up of his anemia revealed iron deficiency anemia thought to be due to chronic blood loss.  Underwent colonoscopy and EGD, thought that blood loss may be related to large hiatal hernia and he will benefit from iron supplementation and PPI use.  Incidentally during hospitalization was also found to have left lower extremity DVT and CTA positive for multiple PEs.  He was started on heparin drip and transitioned to Eliquis, with stable hemoglobin.  TTE was done during hospitalization, which showed EF 55 to 60%, mild LVH, indeterminate diastolic function, normal RV systolic function, severe left atrial dilatation, moderate right atrial dilatation, no significant valvular disease.  His hospital course was also complicated by development of atrial fibrillation with alternating bradycardia.  30-day monitor was ordered as outpatient and he was not started on AV nodal blocker given bradycardia.  30-day monitor showed total AF burden 1%, with longest AF episode lasting 2 hours with average heart rate 105 bpm.  He underwent a Lexiscan Myoview on 05/27/2019 which showed no perfusion defects.  Since last clinic visit, he reports that he has been doing okay.  Does report has been having some shortness  of breath with exertion, possibly occasional chest pain but has difficulty describing symptoms.  He has not been exercising.  He denies any lightheadedness or syncope.  Does report some lower extremity edema.  Denies any palpitations.  He has been taking Eliquis, though reports sometimes he forgets to take his dose.  He denies any bleeding issues.  Wt Readings from Last 3 Encounters:  05/08/21 220 lb (99.8 kg)  10/25/20 210 lb 6.4 oz (95.4 kg)  03/16/20 215 lb (97.5 kg)     Past Medical History:  Diagnosis Date   BPH (benign prostatic hyperplasia)    Hyperlipidemia     Past Surgical History:  Procedure Laterality Date   BIOPSY  03/21/2019   Procedure: BIOPSY;  Surgeon: Ronnette Juniper, MD;  Location: [facility] ENDOSCOPY;  Service: Gastroenterology;;   Cataract surgery     COLONOSCOPY WITH PROPOFOL N/A 03/21/2019   Procedure: COLONOSCOPY WITH PROPOFOL;  Surgeon: Ronnette Juniper, MD;  Location: [place];  Service: Gastroenterology;  Laterality: N/A;   ESOPHAGOGASTRODUODENOSCOPY (EGD) WITH PROPOFOL N/A 03/21/2019   Procedure: ESOPHAGOGASTRODUODENOSCOPY (EGD) WITH PROPOFOL;  Surgeon: Ronnette Juniper, MD;  Location: [place];  Service: Gastroenterology;  Laterality: N/A;   POLYPECTOMY  03/21/2019   Procedure: POLYPECTOMY;  Surgeon: Ronnette Juniper, MD;  Location: [place];  Service: Gastroenterology;;   TONSILLECTOMY     As a child    Current Medications: Current Meds  Medication Sig   ELIQUIS 5 MG TABS tablet Take 5 mg by mouth 2 (two) times daily.   ferrous sulfate 325 (65 FE) MG  tablet Take 1 tablet (325 mg total) by mouth 2 (two) times daily with a meal.   metoprolol succinate (TOPROL XL) 25 MG 24 hr tablet Take 1 tablet (25 mg total) by mouth daily.   Multiple Vitamins-Minerals (PRESERVISION AREDS 2 PO) Take 1 tablet by mouth 2 (two) times daily.    pantoprazole (PROTONIX) 40 MG tablet Take 1 tablet (40 mg total) by mouth daily.   simvastatin (ZOCOR) 20 MG tablet Take 20 mg by mouth at  bedtime.   tamsulosin (FLOMAX) 0.4 MG CAPS capsule Take 0.4 mg by mouth at bedtime.    vitamin B-12 1000 MCG tablet Take 1 tablet (1,000 mcg total) by mouth daily.     Allergies:   Patient has no known allergies.   Social History   Socioeconomic History   Marital status: Single    Spouse name: Not on file   Number of children: Not on file   Years of education: Not on file   Highest education level: Not on file  Occupational History   Not on file  Tobacco Use   Smoking status: Never   Smokeless tobacco: Never  Vaping Use   Vaping Use: Never used  Substance and Sexual Activity   Alcohol use: Never   Drug use: Never   Sexual activity: Not on file  Other Topics Concern   Not on file  Social History Narrative   Not on file   Social Determinants of Health   Financial Resource Strain: Not on file  Food Insecurity: Not on file  Transportation Needs: Not on file  Physical Activity: Not on file  Stress: Not on file  Social Connections: Not on file     Family History: Father had CHF  ROS:   Please see the history of present illness.    All other systems reviewed and are negative.  EKGs/Labs/Other Studies Reviewed:    The following studies were reviewed today:   EKG:  EKG is ordered today.  The ekg ordered today demonstrates sinus rhythm, rate 53, diffuse less than 1 mm ST depressions   TTE 03/19/19:  1. Left ventricular ejection fraction, by visual estimation, is 55 to  60%. The left ventricle has normal function. Left ventricular septal wall  thickness was mildly increased. Mildly increased left ventricular  posterior wall thickness. There is mildly  increased left ventricular hypertrophy.   2. Left ventricular diastolic parameters are indeterminate.   3. The left ventricle has no regional wall motion abnormalities.   4. Global right ventricle has normal systolic function.The right  ventricular size is normal. No increase in right ventricular wall  thickness.    5. Left atrial size was severely dilated.   6. Right atrial size was moderately dilated.   7. The mitral valve is normal in structure. Trivial mitral valve  regurgitation. No evidence of mitral stenosis.   8. The tricuspid valve is normal in structure.   9. The aortic valve is normal in structure. Aortic valve regurgitation is  not visualized. No evidence of aortic valve sclerosis or stenosis.  10. Pulmonic regurgitation is mild.  11. The pulmonic valve was normal in structure. Pulmonic valve  regurgitation is mild.  12. Normal pulmonary artery systolic pressure.  13. The inferior vena cava is normal in size with greater than 50%  respiratory variability, suggesting right atrial pressure of 3 mmHg.   Lexiscan Myoview 05/27/2019: There was no ST segment deviation noted during stress. Defect 1: There is a small defect of moderate severity present  in the apical inferior location. It was present at rest and not at stress, consistent with artifact. The study is normal. This is a low risk study. This study was not gated for systolic function due to frequent PACs and PVCs.     Cardiac monitor 05/22/19: Total AF burden 1%. Longest AF episode lasted 2 hours 9 minutes, average HR 105bpm. Fastest episode with average rate 124 bpm.   Predominant rhythm is sinus rhythm. Range is 43 to 158 bpm bpm with average of 72 bpm. No sustained ventricular tachycardia, significant pause, or high degree AV block. Ventricular ectopy 1%. 9 patient triggered events, which corresponded to sinus rhythm +/- PACs. 4 beat run of NSVT.  Total AF burden 1%.  Longest episode lasted 2 hours 9 minutes, average HR 105bpm.  Fastest episode with average rate 124 bpm.  Also with 2 episodes of atrial flutter with variable conduction, rate 70s.  Recent Labs: 10/25/2020: BUN 19; Creatinine, Ser 0.83; Hemoglobin 14.8; Platelets 172; Potassium 4.2; Sodium 143  Recent Lipid Panel    Component Value Date/Time   CHOL 128 03/16/2020 1050    TRIG 70 03/16/2020 1050   HDL 45 03/16/2020 1050   CHOLHDL 2.8 03/16/2020 1050   LDLCALC 69 03/16/2020 1050    Physical Exam:    VS:  BP 122/78    Pulse 63    Ht 6\' 1"  (1.854 m)    Wt 220 lb (99.8 kg)    SpO2 97%    BMI 29.03 kg/m     Wt Readings from Last 3 Encounters:  05/08/21 220 lb (99.8 kg)  10/25/20 210 lb 6.4 oz (95.4 kg)  03/16/20 215 lb (97.5 kg)     GEN:   in no acute distress HEENT: Normal NECK: No JVD LYMPHATICS: No lymphadenopathy CARDIAC: RRR, 2/6 systolic murmur RESPIRATORY:  Clear to auscultation without rales, wheezing or rhonchi  ABDOMEN: Soft, non-tender, non-distended MUSCULOSKELETAL: Trace edema SKIN: Warm and dry NEUROLOGIC:  Alert and oriented x 3 PSYCHIATRIC:  Normal affect   ASSESSMENT:    1. PAF (paroxysmal atrial fibrillation) ([place])   2. Dyspnea, unspecified type   3. Hyperlipidemia, unspecified hyperlipidemia type   4. Pulmonary embolism without acute cor pulmonale, unspecified chronicity, unspecified pulmonary embolism type (HCC)      PLAN:    Paroxysmal atrial fibrillation: CHA2DS2-VASc score 3 (agex2, DVT/PE).  30-day monitor showed AF burden 1%, with longest episode lasting 2 hours with average heart rate 105 bpm.  Echo showed EF 55 to 60%, mild LVH, indeterminate diastolic function, normal RV systolic function, severe left atrial dilatation, moderate right atrial dilatation, no significant valvular disease -Continue Eliquis 5 mg twice daily.  Labs from 04/17/21 with hemoglobin 14.2, creatinine 0.77 -Given episodes of RVR on monitor, started on Toprol-XL 25 mg daily.  Has been tolerating well  PE/DVT: Diagnosed during hospitalization for anemia, started on Eliquis 5 mg twice daily as above.  No evidence of bleeding on Eliquis.  Chest pain: describes dull aching pain in chest with exertion.  Lexiscan Myoview shows no evidence of ischemia.  Reports chest pain has improved  Dyspnea: Reports dyspnea with exertion and having some lower  extremity edema.  Will check echocardiogram  Hyperlipidemia: On simvastatin 20 mg daily.  LDL 84 on 04/17/2021.  Check calcium score to guide how aggressive to be in lowering cholesterol  RTC in 6 months  Medication Adjustments/Labs and Tests Ordered: Current medicines are reviewed at length with the patient today.  Concerns regarding medicines are outlined above.  Orders Placed This Encounter  Procedures   CT CARDIAC SCORING (SELF PAY ONLY)   EKG 12-Lead   ECHOCARDIOGRAM COMPLETE    No orders of the defined types were placed in this encounter.    Patient Instructions  Medication Instructions:  NO CHANGES  *If you need a refill on your cardiac medications before your next appointment, please call your pharmacy*  Testing/Procedures: Dr. Gardiner Rhyme has ordered a CT coronary calcium score. This is $99 out of pocket.   Dr. Gardiner Rhyme has ordered an echocardiogram.Echocardiography is a painless test that uses sound waves to create images of your heart. It provides your doctor with information about the size and shape of your heart and how well your hearts chambers and valves are working. This procedure takes approximately one hour. There are no restrictions for this procedure.  Test locations:  [facility] ([address] [place], [place] [address]) [facility] ([address] [place], [place] [address])     Coronary CalciumScan A coronary calcium scan is an imaging test used to look for deposits of calcium and other fatty materials (plaques) in the inner lining of the blood vessels of the heart (coronary arteries). These deposits of calcium and plaques can partly clog and narrow the coronary arteries without producing any symptoms or warning signs. This puts a person at risk for a heart attack. This test can detect these deposits before symptoms develop. Tell a health care provider about: Any allergies you have. All medicines you are taking, including vitamins,  herbs, eye drops, creams, and over-the-counter medicines. Any problems you or family members have had with anesthetic medicines. Any blood disorders you have. Any surgeries you have had. Any medical conditions you have. Whether you are pregnant or may be pregnant. What are the risks? Generally, this is a safe procedure. However, problems may occur, including: Harm to a pregnant woman and her unborn baby. This test involves the use of radiation. Radiation exposure can be dangerous to a pregnant woman and her unborn baby. If you are pregnant, you generally should not have this procedure done. Slight increase in the risk of cancer. This is because of the radiation involved in the test. What happens before the procedure? No preparation is needed for this procedure. What happens during the procedure? You will undress and remove any jewelry around your neck or chest. You will put on a hospital gown. Sticky electrodes will be placed on your chest. The electrodes will be connected to an electrocardiogram (ECG) machine to record a tracing of the electrical activity of your heart. A CT scanner will take pictures of your heart. During this time, you will be asked to lie still and hold your breath for 2-3 seconds while a picture of your heart is being taken. The procedure may vary among health care providers and hospitals. What happens after the procedure? You can get dressed. You can return to your normal activities. It is up to you to get the results of your test. Ask your health care provider, or the department that is doing the test, when your results will be ready. Summary A coronary calcium scan is an imaging test used to look for deposits of calcium and other fatty materials (plaques) in the inner lining of the blood vessels of the heart (coronary arteries). Generally, this is a safe procedure. Tell your health care provider if you are pregnant or may be pregnant. No preparation is needed for  this procedure. A CT scanner will take pictures of your  heart. You can return to your normal activities after the scan is done. This information is not intended to replace advice given to you by your health care provider. Make sure you discuss any questions you have with your health care provider. Document Released: 08/18/2007 Document Revised: 01/09/2016 Document Reviewed: 01/09/2016 Elsevier Interactive Patient Education  2017 [place]: At [facility], you and your health needs are our priority.  As part of our continuing mission to provide you with exceptional heart care, we have created designated Provider Care Teams.  These Care Teams include your primary Cardiologist (physician) and Advanced Practice Providers (APPs -  Physician Assistants and Nurse Practitioners) who all work together to provide you with the care you need, when you need it.  We recommend signing up for the patient portal called "MyChart".  Sign up information is provided on this After Visit Summary.  MyChart is used to connect with patients for Virtual Visits (Telemedicine).  Patients are able to view lab/test results, encounter notes, upcoming appointments, etc.  Non-urgent messages can be sent to your provider as well.   To learn more about what you can do with MyChart, go to [email or web address].    Your next appointment:    6 months with Dr. Gardiner Rhyme   Signed, Donato Heinz, MD  05/11/2021 9:38 PM    [place]

## 2021-05-08 ENCOUNTER — Encounter: Payer: Self-pay | Admitting: Cardiology

## 2021-05-08 ENCOUNTER — Other Ambulatory Visit: Payer: Self-pay

## 2021-05-08 ENCOUNTER — Ambulatory Visit: Payer: Federal, State, Local not specified - PPO | Admitting: Cardiology

## 2021-05-08 VITALS — BP 122/78 | HR 63 | Ht 73.0 in | Wt 220.0 lb

## 2021-05-08 DIAGNOSIS — I480 Paroxysmal atrial fibrillation: Secondary | ICD-10-CM

## 2021-05-08 DIAGNOSIS — E785 Hyperlipidemia, unspecified: Secondary | ICD-10-CM

## 2021-05-08 DIAGNOSIS — R0600 Dyspnea, unspecified: Secondary | ICD-10-CM | POA: Diagnosis not present

## 2021-05-08 DIAGNOSIS — I2699 Other pulmonary embolism without acute cor pulmonale: Secondary | ICD-10-CM | POA: Diagnosis not present

## 2021-05-08 NOTE — Patient Instructions (Signed)
Medication Instructions:  ?NO CHANGES ? ?*If you need a refill on your cardiac medications before your next appointment, please call your pharmacy* ? ?Testing/Procedures: ?Dr. Bjorn Pippin has ordered a CT coronary calcium score. This is $99 out of pocket.  ? ?Dr. Bjorn Pippin has ordered an echocardiogram.Echocardiography is a painless test that uses sound waves to create images of your heart. It provides your doctor with information about the size and shape of your heart and how well your heart?s chambers and valves are working. This procedure takes approximately one hour. There are no restrictions for this procedure. ? ?Test locations:  ?[facility] ([address] [place], [place] [address]) ?[facility] ([address] [place], [place] [address])  ? ? ? ?Coronary CalciumScan ?A coronary calcium scan is an imaging test used to look for deposits of calcium and other fatty materials (plaques) in the inner lining of the blood vessels of the heart (coronary arteries). These deposits of calcium and plaques can partly clog and narrow the coronary arteries without producing any symptoms or warning signs. This puts a person at risk for a heart attack. This test can detect these deposits before symptoms develop. ?Tell a health care provider about: ?Any allergies you have. ?All medicines you are taking, including vitamins, herbs, eye drops, creams, and over-the-counter medicines. ?Any problems you or family members have had with anesthetic medicines. ?Any blood disorders you have. ?Any surgeries you have had. ?Any medical conditions you have. ?Whether you are pregnant or may be pregnant. ?What are the risks? ?Generally, this is a safe procedure. However, problems may occur, including: ?Harm to a pregnant woman and her unborn baby. This test involves the use of radiation. Radiation exposure can be dangerous to a pregnant woman and her unborn baby. If you are pregnant, you generally should not have this procedure  done. ?Slight increase in the risk of cancer. This is because of the radiation involved in the test. ?What happens before the procedure? ?No preparation is needed for this procedure. ?What happens during the procedure? ?You will undress and remove any jewelry around your neck or chest. ?You will put on a hospital gown. ?Sticky electrodes will be placed on your chest. The electrodes will be connected to an electrocardiogram (ECG) machine to record a tracing of the electrical activity of your heart. ?A CT scanner will take pictures of your heart. During this time, you will be asked to lie still and hold your breath for 2-3 seconds while a picture of your heart is being taken. ?The procedure may vary among health care providers and hospitals. ?What happens after the procedure? ?You can get dressed. ?You can return to your normal activities. ?It is up to you to get the results of your test. Ask your health care provider, or the department that is doing the test, when your results will be ready. ?Summary ?A coronary calcium scan is an imaging test used to look for deposits of calcium and other fatty materials (plaques) in the inner lining of the blood vessels of the heart (coronary arteries). ?Generally, this is a safe procedure. Tell your health care provider if you are pregnant or may be pregnant. ?No preparation is needed for this procedure. ?A CT scanner will take pictures of your heart. ?You can return to your normal activities after the scan is done. ?This information is not intended to replace advice given to you by your health care provider. Make sure you discuss any questions you have with your health care provider. ?Document  Released: 08/18/2007 Document Revised: 01/09/2016 Document Reviewed: 01/09/2016 ?Elsevier Interactive Patient Education ? 2017 Elsevier Inc. ? ? ? ?Follow-Up: ?At [facility], you and your health needs are our priority.  As part of our continuing mission to provide you with exceptional  heart care, we have created designated Provider Care Teams.  These Care Teams include your primary Cardiologist (physician) and Advanced Practice Providers (APPs -  Physician Assistants and Nurse Practitioners) who all work together to provide you with the care you need, when you need it. ? ?We recommend signing up for the patient portal called "MyChart".  Sign up information is provided on this After Visit Summary.  MyChart is used to connect with patients for Virtual Visits (Telemedicine).  Patients are able to view lab/test results, encounter notes, upcoming appointments, etc.  Non-urgent messages can be sent to your provider as well.   ?To learn more about what you can do with MyChart, go to [email or web address].   ? ?Your next appointment:   ? ?6 months with Dr. Bjorn Pippin ?

## 2021-05-09 IMAGING — CT CT ANGIO CHEST
2 of 7 series · 18 of 46 positions shown · IV contrast (APPLIED)
Comparison: None.

CLINICAL DATA: Deep venous thrombosis and chest pain.

EXAM:
CT ANGIOGRAPHY CHEST WITH CONTRAST
TECHNIQUE: Multidetector CT imaging of the chest was performed using the
standard protocol during bolus administration of intravenous
contrast. Multiplanar CT image reconstructions and MIPs were
obtained to evaluate the vascular anatomy.
CONTRAST:  78mL OMNIPAQUE IOHEXOL 350 MG/ML SOLN

[Series 7: thins · axial · 0.68mm/px · z∈[-318,-28]mm · 15 of 466 slices shown]
[im 26/466  lung]
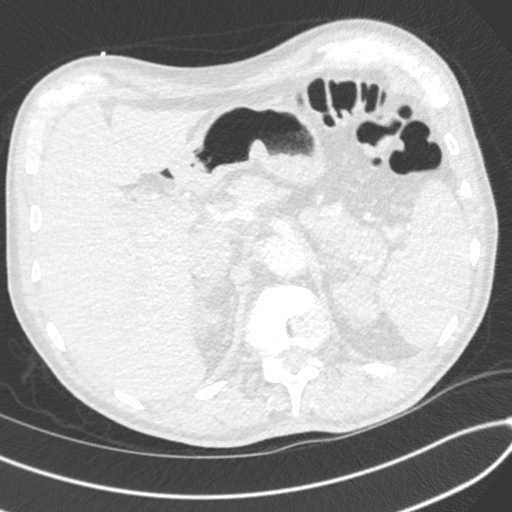
[im 52/466  soft-tissue]
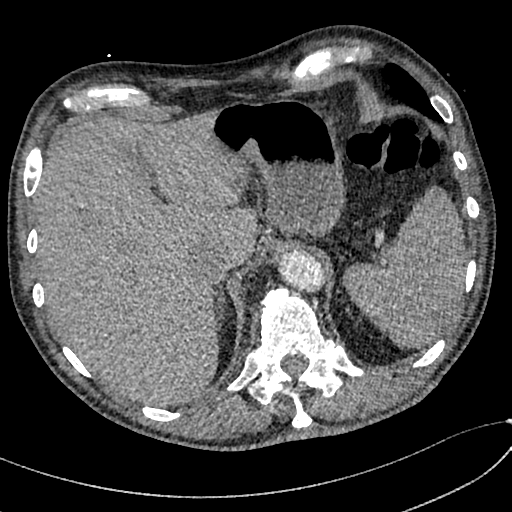
[im 78/466  lung]
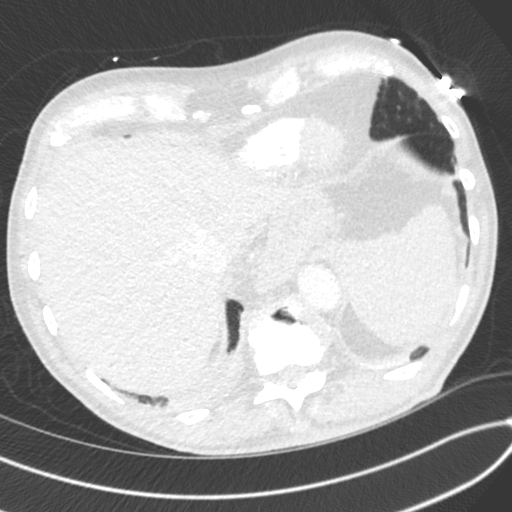
[im 104/466  soft-tissue]
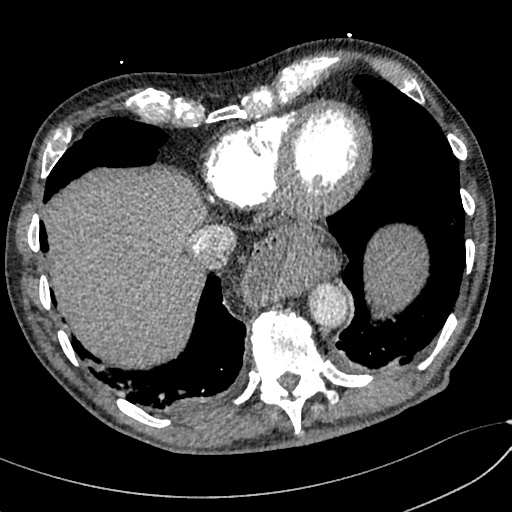
[im 156/466  lung]
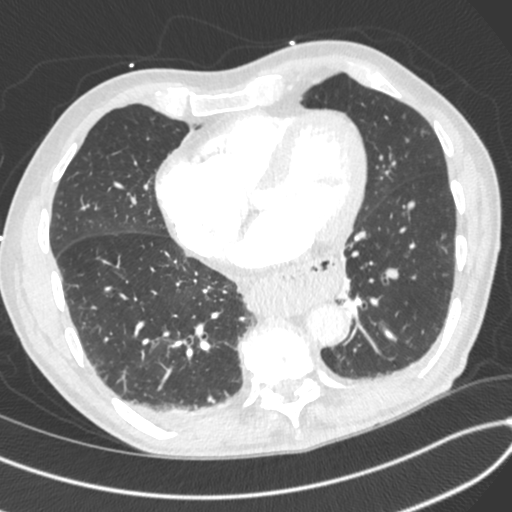
[im 181/466  soft-tissue]
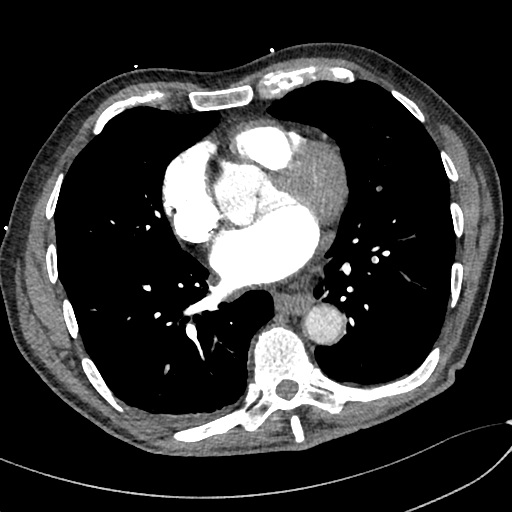
[im 207/466  lung]
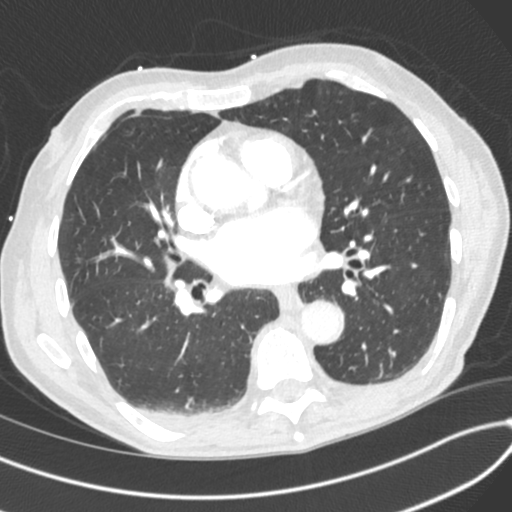
[im 233/466  soft-tissue]
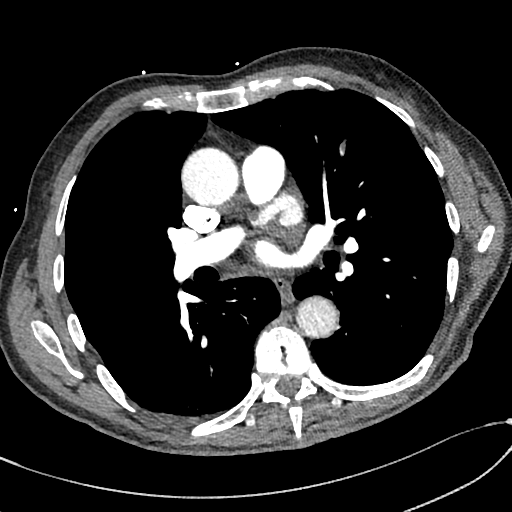
[im 259/466  lung]
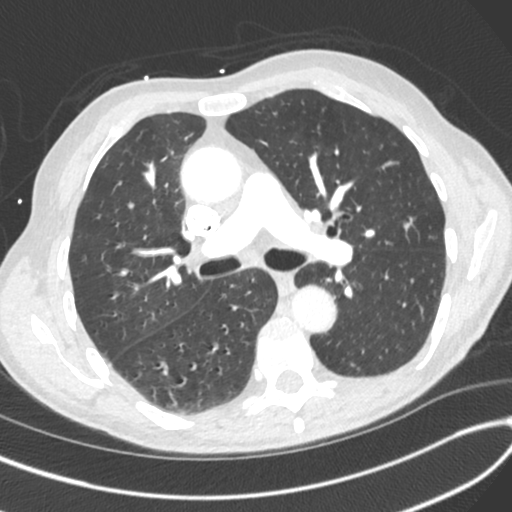
[im 285/466  soft-tissue]
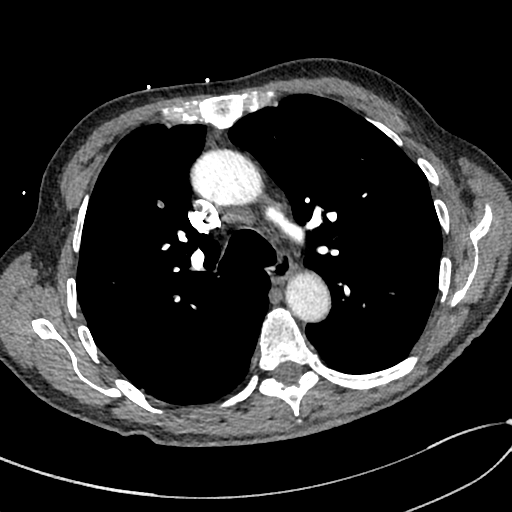
[im 311/466  lung]
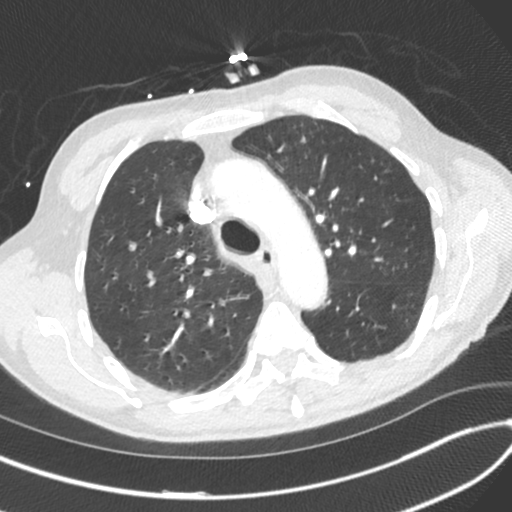
[im 362/466  soft-tissue]
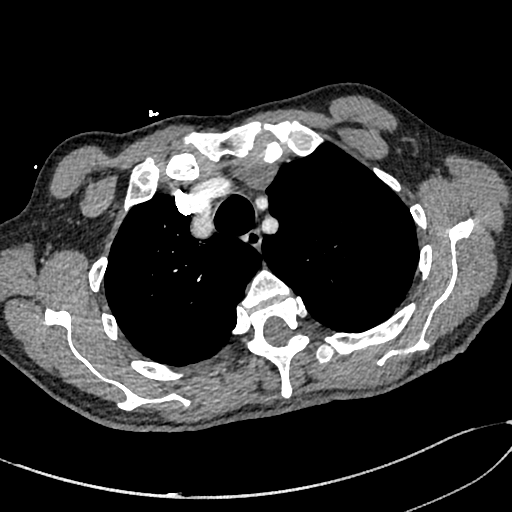
[im 388/466  lung]
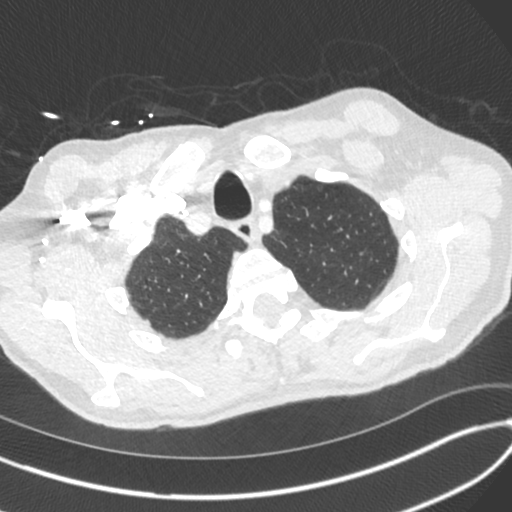
[im 414/466  soft-tissue]
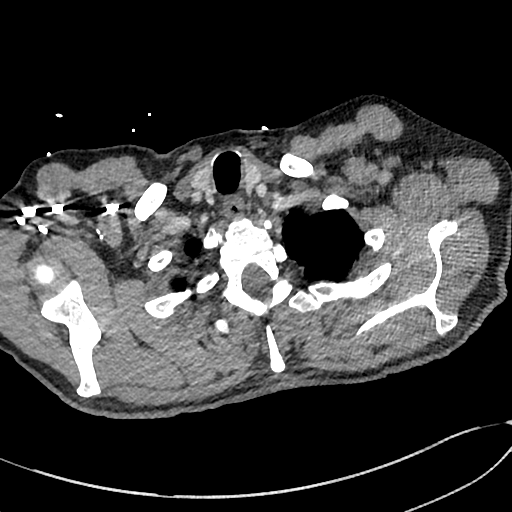
[im 440/466  lung]
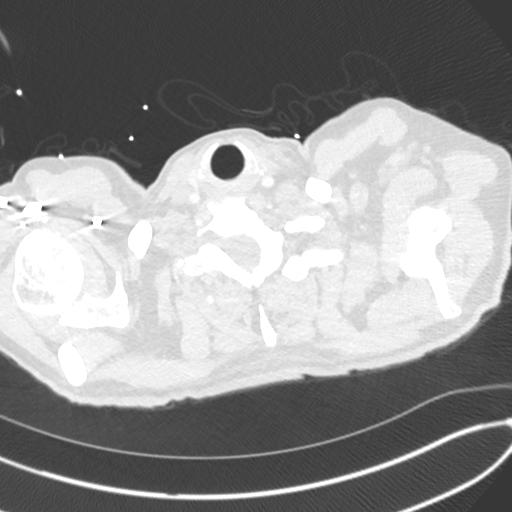

[Series 8: cor · coronal · 0.64mm/px · 3 of 149 slices shown]
[im 38/149  soft-tissue]
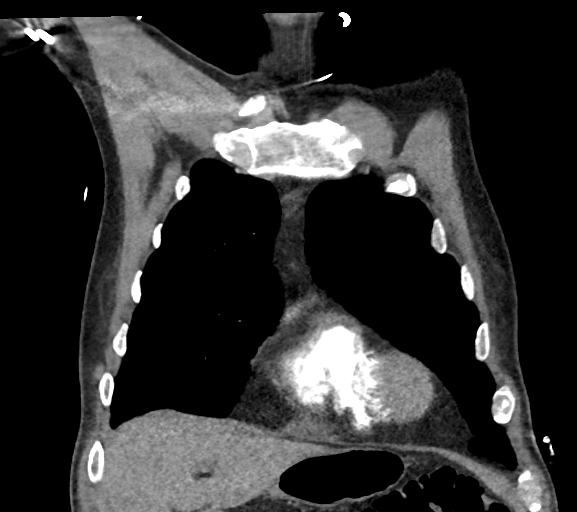
[im 75/149  soft-tissue]
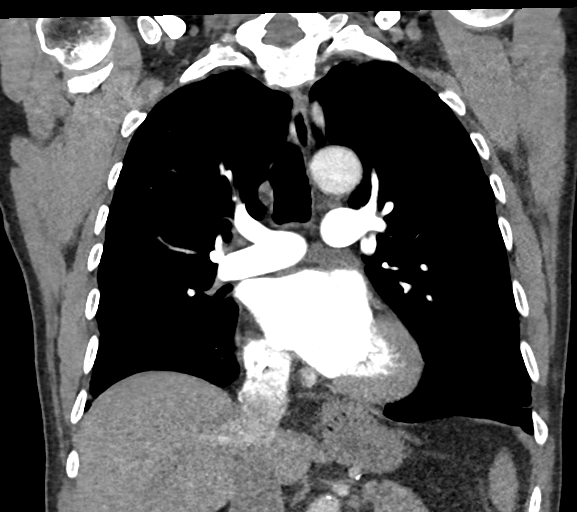
[im 112/149  soft-tissue]
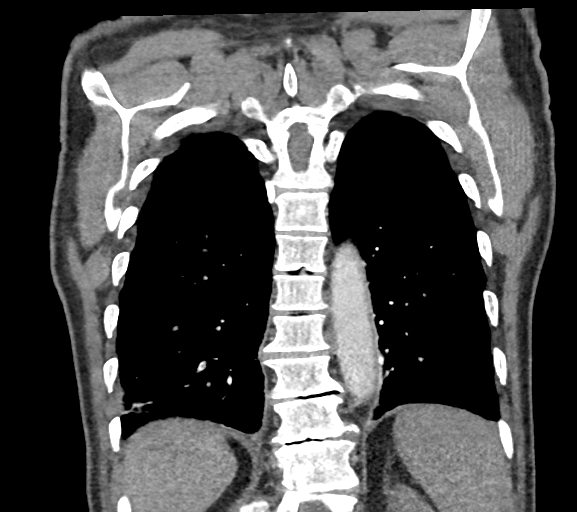

[18 of 46 positions shown; findings below may reference images not displayed]

FINDINGS: Cardiovascular: The heart is normal in size. No pericardial
effusion. The aorta is normal in caliber. No dissection. Minimal
atherosclerotic calcifications at the aortic arch. The branch
vessels are patent. Scattered coronary artery calcifications.

The pulmonary arterial tree is well opacified. There are scattered
peripheral filling defects consistent with pulmonary emboli. No
large central pulmonary emboli. No findings for right heart strain.

Mediastinum/Nodes: No mediastinal or hilar mass or adenopathy. Small
scattered lymph nodes are noted. There is a moderate to large hiatal
hernia.

Lungs/Pleura: Moderate breathing motion artifact. No worrisome
pulmonary lesions or pulmonary infiltrates. Very small bilateral
pleural effusions and bibasilar atelectasis.

Upper Abdomen: No significant upper abdominal findings.

Musculoskeletal: No chest wall mass, supraclavicular or axillary
adenopathy.

The bony thorax is intact. Mild degenerative changes involving the
thoracic spine and upper lumbar spine.

Review of the MIP images confirms the above findings.
IMPRESSION: 1. Scattered peripheral lower lobe pulmonary emboli but no large
central pulmonary emboli and no findings for right heart strain.
2. Normal thoracic aorta.
3. Very small bilateral pleural effusions and bibasilar atelectasis.
4. Moderate to large hiatal hernia.

These results will be called to the ordering clinician or
representative by the Radiologist Assistant, and communication
documented in the PACS or zVision Dashboard.

## 2021-05-20 ENCOUNTER — Other Ambulatory Visit: Payer: Self-pay | Admitting: Cardiology

## 2021-06-02 ENCOUNTER — Ambulatory Visit (HOSPITAL_COMMUNITY): Payer: Federal, State, Local not specified - PPO | Attending: Cardiology

## 2021-06-02 ENCOUNTER — Ambulatory Visit (INDEPENDENT_AMBULATORY_CARE_PROVIDER_SITE_OTHER)
Admission: RE | Admit: 2021-06-02 | Discharge: 2021-06-02 | Disposition: A | Discharge status: Home / Self Care | Payer: Self-pay | Source: Ambulatory Visit | Attending: Cardiology | Admitting: Cardiology

## 2021-06-02 DIAGNOSIS — R0609 Other forms of dyspnea: Secondary | ICD-10-CM | POA: Diagnosis not present

## 2021-06-02 DIAGNOSIS — R0600 Dyspnea, unspecified: Secondary | ICD-10-CM | POA: Diagnosis not present

## 2021-06-02 DIAGNOSIS — E785 Hyperlipidemia, unspecified: Secondary | ICD-10-CM

## 2021-06-02 LAB — ECHOCARDIOGRAM COMPLETE
Area-P 1/2: 2.69 cm2
Calc EF: 51.1 %
S' Lateral: 3.3 cm
Single Plane A2C EF: 54.4 %
Single Plane A4C EF: 50.3 %

## 2021-06-05 DIAGNOSIS — Z23 Encounter for immunization: Secondary | ICD-10-CM | POA: Diagnosis not present

## 2021-06-05 DIAGNOSIS — Z0000 Encounter for general adult medical examination without abnormal findings: Secondary | ICD-10-CM | POA: Diagnosis not present

## 2021-06-08 ENCOUNTER — Other Ambulatory Visit: Payer: Self-pay | Admitting: *Deleted

## 2021-06-08 DIAGNOSIS — I77819 Aortic ectasia, unspecified site: Secondary | ICD-10-CM

## 2021-06-08 MED ORDER — ATORVASTATIN CALCIUM 20 MG PO TABS: 20.0000 mg | ORAL_TABLET | Freq: Every day | ORAL | 3 refills | Status: DC

## 2021-06-12 DIAGNOSIS — N390 Urinary tract infection, site not specified: Secondary | ICD-10-CM | POA: Diagnosis not present

## 2021-06-27 DIAGNOSIS — N390 Urinary tract infection, site not specified: Secondary | ICD-10-CM | POA: Diagnosis not present

## 2021-07-25 DIAGNOSIS — N401 Enlarged prostate with lower urinary tract symptoms: Secondary | ICD-10-CM | POA: Diagnosis not present

## 2021-07-25 DIAGNOSIS — R3912 Poor urinary stream: Secondary | ICD-10-CM | POA: Diagnosis not present

## 2021-08-03 DIAGNOSIS — H35372 Puckering of macula, left eye: Secondary | ICD-10-CM | POA: Diagnosis not present

## 2021-08-03 DIAGNOSIS — H348312 Tributary (branch) retinal vein occlusion, right eye, stable: Secondary | ICD-10-CM | POA: Diagnosis not present

## 2021-08-03 DIAGNOSIS — H353132 Nonexudative age-related macular degeneration, bilateral, intermediate dry stage: Secondary | ICD-10-CM | POA: Diagnosis not present

## 2021-08-03 DIAGNOSIS — H442E3 Degenerative myopia with other maculopathy, bilateral eye: Secondary | ICD-10-CM | POA: Diagnosis not present

## 2021-08-03 DIAGNOSIS — H3582 Retinal ischemia: Secondary | ICD-10-CM | POA: Diagnosis not present

## 2021-08-24 DIAGNOSIS — R3912 Poor urinary stream: Secondary | ICD-10-CM | POA: Diagnosis not present

## 2021-08-24 DIAGNOSIS — N401 Enlarged prostate with lower urinary tract symptoms: Secondary | ICD-10-CM | POA: Diagnosis not present

## 2021-08-24 DIAGNOSIS — N35912 Unspecified bulbous urethral stricture, male: Secondary | ICD-10-CM | POA: Diagnosis not present

## 2021-08-28 ENCOUNTER — Telehealth: Payer: Self-pay | Admitting: Cardiology

## 2021-08-31 ENCOUNTER — Ambulatory Visit
Admission: RE | Admit: 2021-08-31 | Discharge: 2021-08-31 | Disposition: A | Discharge status: Home / Self Care | Payer: Federal, State, Local not specified - PPO | Source: Ambulatory Visit | Attending: Physician Assistant | Admitting: Physician Assistant

## 2021-08-31 ENCOUNTER — Other Ambulatory Visit: Payer: Self-pay | Admitting: Physician Assistant

## 2021-08-31 DIAGNOSIS — R600 Localized edema: Secondary | ICD-10-CM | POA: Diagnosis not present

## 2021-08-31 DIAGNOSIS — L309 Dermatitis, unspecified: Secondary | ICD-10-CM | POA: Diagnosis not present

## 2021-09-12 DIAGNOSIS — L0390 Cellulitis, unspecified: Secondary | ICD-10-CM | POA: Diagnosis not present

## 2021-09-12 DIAGNOSIS — R600 Localized edema: Secondary | ICD-10-CM | POA: Diagnosis not present

## 2021-09-12 DIAGNOSIS — L309 Dermatitis, unspecified: Secondary | ICD-10-CM | POA: Diagnosis not present

## 2021-09-27 DIAGNOSIS — H353212 Exudative age-related macular degeneration, right eye, with inactive choroidal neovascularization: Secondary | ICD-10-CM | POA: Diagnosis not present

## 2021-11-07 NOTE — Progress Notes (Unsigned)
Cardiology Office Note:    Date:  11/08/2021   ID:  Joshua Estrada, DOB 09-Jun-1940, MRN [id number]  PCP:  Johny Blamer, MD  Cardiologist:  Little Ishikawa, MD  Electrophysiologist:  None   Referring MD: Johny Blamer, MD   Chief Complaint  Patient presents with   Edema    History of Present Illness:    Joshua Estrada is a 81 y.o. male with a hx of paroxysmal atrial fibrillation, DVT/PE severe anemia, HLD, BPH, GERD who presents for follow-up.  He was referred by Dr. Tiburcio Pea for evaluation of dyspnea on exertion, initially seen on 03/17/2019 .  As part of the work-up for his dyspnea, CBC was checked and found to have severe anemia (hemoglobin 4.2).  He was admitted to [facility] for further evaluation.  Work-up of his anemia revealed iron deficiency anemia thought to be due to chronic blood loss.  Underwent colonoscopy and EGD, thought that blood loss may be related to large hiatal hernia and he will benefit from iron supplementation and PPI use.  Incidentally during hospitalization was also found to have left lower extremity DVT and CTA positive for multiple PEs.  He was started on heparin drip and transitioned to Eliquis, with stable hemoglobin.  TTE was done during hospitalization, which showed EF 55 to 60%, mild LVH, indeterminate diastolic function, normal RV systolic function, severe left atrial dilatation, moderate right atrial dilatation, no significant valvular disease.  His hospital course was also complicated by development of atrial fibrillation with alternating bradycardia.  30-day monitor was ordered as outpatient and he was not started on AV nodal blocker given bradycardia.  30-day monitor showed total AF burden 1%, with longest AF episode lasting 2 hours with average heart rate 105 bpm.  He underwent a Lexiscan Myoview on 05/27/2019 which showed no perfusion defects.  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   Echocardiogram 06/02/2021 showed EF 55 to 60%, normal RV function, no significant valvular disease.  Since last clinic visit, reports has been doing okay.  Has had swelling in legs, right greater than left.  Underwent lower extremity duplex which was negative.  Reports was on Lasix briefly but has been discontinued.  He denies any chest pain, lightheadedness, syncope, or palpitations.  Reports some dyspnea on exertion.  He is taking Eliquis, denies any bleeding issues.   Wt Readings from Last 3 Encounters:  11/08/21 207 lb 6.4 oz (94.1 kg)  05/08/21 220 lb (99.8 kg)  10/25/20 210 lb 6.4 oz (95.4 kg)     Past Medical History:  Diagnosis Date   BPH (benign prostatic hyperplasia)    Hyperlipidemia     Past Surgical History:  Procedure Laterality Date   BIOPSY  03/21/2019   Procedure: BIOPSY;  Surgeon: Kerin Salen, MD;  Location: [facility] ENDOSCOPY;  Service: Gastroenterology;;   Cataract surgery     COLONOSCOPY WITH PROPOFOL N/A 03/21/2019   Procedure: COLONOSCOPY WITH PROPOFOL;  Surgeon: Kerin Salen, MD;  Location: [facility] ENDOSCOPY;  Service: Gastroenterology;  Laterality: N/A;   ESOPHAGOGASTRODUODENOSCOPY (EGD) WITH PROPOFOL N/A 03/21/2019   Procedure: ESOPHAGOGASTRODUODENOSCOPY (EGD) WITH PROPOFOL;  Surgeon: Kerin Salen, MD;  Location: [facility] ENDOSCOPY;  Service: Gastroenterology;  Laterality: N/A;   POLYPECTOMY  03/21/2019   Procedure: POLYPECTOMY;  Surgeon: Kerin Salen, MD;  Location: [facility] ENDOSCOPY;  Service: Gastroenterology;;   TONSILLECTOMY     As a child    Current Medications: Current Meds  Medication Sig   atorvastatin (LIPITOR) 20 MG  tablet Take 1 tablet (20 mg total) by mouth daily.   ELIQUIS 5 MG TABS tablet Take 5 mg by mouth 2 (two) times daily.   ferrous sulfate 325 (65 FE) MG tablet Take 1 tablet (325 mg total) by mouth 2 (two) times daily with a meal.   metoprolol succinate (TOPROL-XL) 25 MG 24 hr tablet TAKE 1 TABLET (25 MG TOTAL) BY MOUTH DAILY.   Multiple Vitamins-Minerals  (PRESERVISION AREDS 2 PO) Take 1 tablet by mouth 2 (two) times daily.    pantoprazole (PROTONIX) 40 MG tablet Take 1 tablet (40 mg total) by mouth daily.   tamsulosin (FLOMAX) 0.4 MG CAPS capsule Take 0.4 mg by mouth at bedtime.    vitamin B-12 1000 MCG tablet Take 1 tablet (1,000 mcg total) by mouth daily.     Allergies:   Patient has no known allergies.   Social History   Socioeconomic History   Marital status: Single    Spouse name: Not on file   Number of children: Not on file   Years of education: Not on file   Highest education level: Not on file  Occupational History   Not on file  Tobacco Use   Smoking status: Never   Smokeless tobacco: Never  Vaping Use   Vaping Use: Never used  Substance and Sexual Activity   Alcohol use: Never   Drug use: Never   Sexual activity: Not on file  Other Topics Concern   Not on file  Social History Narrative   Not on file   Social Determinants of Health   Financial Resource Strain: Not on file  Food Insecurity: Not on file  Transportation Needs: Not on file  Physical Activity: Not on file  Stress: Not on file  Social Connections: Not on file     Family History: Father had CHF  ROS:   Please see the history of present illness.    All other systems reviewed and are negative.  EKGs/Labs/Other Studies Reviewed:    The following studies were reviewed today:   EKG:   11/08/2021: Sinus rhythm, PACs, rate 62  TTE 03/19/19:  1. Left ventricular ejection fraction, by visual estimation, is 55 to  60%. The left ventricle has normal function. Left ventricular septal wall  thickness was mildly increased. Mildly increased left ventricular  posterior wall thickness. There is mildly  increased left ventricular hypertrophy.   2. Left ventricular diastolic parameters are indeterminate.   3. The left ventricle has no regional wall motion abnormalities.   4. Global right ventricle has normal systolic function.The right  ventricular size  is normal. No increase in right ventricular wall  thickness.   5. Left atrial size was severely dilated.   6. Right atrial size was moderately dilated.   7. The mitral valve is normal in structure. Trivial mitral valve  regurgitation. No evidence of mitral stenosis.   8. The tricuspid valve is normal in structure.   9. The aortic valve is normal in structure. Aortic valve regurgitation is  not visualized. No evidence of aortic valve sclerosis or stenosis.  10. Pulmonic regurgitation is mild.  11. The pulmonic valve was normal in structure. Pulmonic valve  regurgitation is mild.  12. Normal pulmonary artery systolic pressure.  13. The inferior vena cava is normal in size with greater than 50%  respiratory variability, suggesting right atrial pressure of 3 mmHg.   Lexiscan Myoview 05/27/2019: There was no ST segment deviation noted during stress. Defect 1: There is a small defect  of moderate severity present in the apical inferior location. It was present at rest and not at stress, consistent with artifact. The study is normal. This is a low risk study. This study was not gated for systolic function due to frequent PACs and PVCs.     Cardiac monitor 05/22/19: Total AF burden 1%. Longest AF episode lasted 2 hours 9 minutes, average HR 105bpm. Fastest episode with average rate 124 bpm.   Predominant rhythm is sinus rhythm. Range is 43 to 158 bpm bpm with average of 72 bpm. No sustained ventricular tachycardia, significant pause, or high degree AV block. Ventricular ectopy 1%. 9 patient triggered events, which corresponded to sinus rhythm +/- PACs. 4 beat run of NSVT.  Total AF burden 1%.  Longest episode lasted 2 hours 9 minutes, average HR 105bpm.  Fastest episode with average rate 124 bpm.  Also with 2 episodes of atrial flutter with variable conduction, rate 70s.  Recent Labs: No results found for requested labs within last 365 days.  Recent Lipid Panel    Component Value Date/Time    CHOL 128 03/16/2020 1050   TRIG 70 03/16/2020 1050   HDL 45 03/16/2020 1050   CHOLHDL 2.8 03/16/2020 1050   LDLCALC 69 03/16/2020 1050    Physical Exam:    VS:  BP 124/80   Pulse 62   Ht 6\' 1"  (1.854 m)   Wt 207 lb 6.4 oz (94.1 kg)   SpO2 98%   BMI 27.36 kg/m     Wt Readings from Last 3 Encounters:  11/08/21 207 lb 6.4 oz (94.1 kg)  05/08/21 220 lb (99.8 kg)  10/25/20 210 lb 6.4 oz (95.4 kg)     GEN:   in no acute distress HEENT: Normal NECK: No JVD CARDIAC: RRR, 2/6 systolic murmur RESPIRATORY:  Clear to auscultation without rales, wheezing or rhonchi  ABDOMEN: Soft, non-tender, non-distended MUSCULOSKELETAL: 1+ BLE edema, R>L SKIN: Warm and dry NEUROLOGIC:  Alert and oriented x 3 PSYCHIATRIC:  Normal affect   ASSESSMENT:    1. PAF (paroxysmal atrial fibrillation) (HCC)   2. Lower extremity edema   3. CAD in native artery   4. Hyperlipidemia, unspecified hyperlipidemia type   5. Dyspnea, unspecified type   6. Aortic dilatation (HCC)     PLAN:    Paroxysmal atrial fibrillation: CHA2DS2-VASc score 3 (agex2, DVT/PE).  30-day monitor showed AF burden 1%, with longest episode lasting 2 hours with average heart rate 105 bpm.  Echo showed EF 55 to 60%, mild LVH, indeterminate diastolic function, normal RV systolic function, severe left atrial dilatation, moderate right atrial dilatation, no significant valvular disease -Continue Eliquis 5 mg twice daily.   -Continue Toprol-XL 25 mg daily.  H  Lower extremity edema: Has noted bilateral lower extremity edema, right greater than left.  Underwent right lower extremity duplex which showed no DVT.  Check CMET, BNP  PE/DVT: Diagnosed during hospitalization for anemia, started on Eliquis 5 mg twice daily as above.  No evidence of bleeding on Eliquis.  CAD: describes dull aching pain in chest with exertion.  Lexiscan Myoview shows no evidence of ischemia.  Calcium score 06/02/2021 was 205 (40th percentile) -Reports chest pain  has improved -Continue Eliquis, statin  Dyspnea: Reports dyspnea with exertion and having some lower extremity edema.  Echocardiogram 06/02/2021 showed EF 55 to 60%, normal RV function, no significant valvular disease.  Hyperlipidemia: On simvastatin 20 mg daily.  LDL 84 on 04/17/2021.  Calcium score 06/02/2021 was 205 (40th percentile), switch to  atorvastatin 20 mg daily.  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  Check CTA chest in 1 year to follow  RTC in 6 months  Medication Adjustments/Labs and Tests Ordered: Current medicines are reviewed at length with the patient today.  Concerns regarding medicines are outlined above.  Orders Placed This Encounter  Procedures   Comprehensive metabolic panel   CBC   Brain natriuretic peptide   Lipid panel   EKG 12-Lead    No orders of the defined types were placed in this encounter.    Patient Instructions  Medication Instructions:  Your physician recommends that you continue on your current medications as directed. Please refer to the Current Medication list given to you today.  *If you need a refill on your cardiac medications before your next appointment, please call your pharmacy*   Lab Work: CMET, CBC, Lipid, BNP today  If you have labs (blood work) drawn today and your tests are completely normal, you will receive your results only by: MyChart Message (if you have MyChart) OR A paper copy in the mail If you have any lab test that is abnormal or we need to change your treatment, we will call you to review the results.  Follow-Up: At [facility], you and your health needs are our priority.  As part of our continuing mission to provide you with exceptional heart care, we have created designated Provider Care Teams.  These Care Teams include your primary Cardiologist (physician) and Advanced Practice Providers (APPs -  Physician Assistants and Nurse Practitioners) who all work  together to provide you with the care you need, when you need it.  We recommend signing up for the patient portal called "MyChart".  Sign up information is provided on this After Visit Summary.  MyChart is used to connect with patients for Virtual Visits (Telemedicine).  Patients are able to view lab/test results, encounter notes, upcoming appointments, etc.  Non-urgent messages can be sent to your provider as well.   To learn more about what you can do with MyChart, go to [email or web address].    Your next appointment:   6 month(s)  The format for your next appointment:   In Person  Provider:   Little Ishikawa, MD      Important Information About Sugar         Signed, Little Ishikawa, MD  11/08/2021 6:06 PM    [facility]

## 2021-11-08 ENCOUNTER — Encounter: Payer: Self-pay | Admitting: Cardiology

## 2021-11-08 ENCOUNTER — Ambulatory Visit: Payer: Federal, State, Local not specified - PPO | Attending: Cardiology | Admitting: Cardiology

## 2021-11-08 VITALS — BP 124/80 | HR 62 | Ht 73.0 in | Wt 207.4 lb

## 2021-11-08 DIAGNOSIS — I480 Paroxysmal atrial fibrillation: Secondary | ICD-10-CM | POA: Diagnosis not present

## 2021-11-08 DIAGNOSIS — R0600 Dyspnea, unspecified: Secondary | ICD-10-CM

## 2021-11-08 DIAGNOSIS — R600 Localized edema: Secondary | ICD-10-CM

## 2021-11-08 DIAGNOSIS — E785 Hyperlipidemia, unspecified: Secondary | ICD-10-CM

## 2021-11-08 DIAGNOSIS — I77819 Aortic ectasia, unspecified site: Secondary | ICD-10-CM

## 2021-11-08 DIAGNOSIS — I2510 Atherosclerotic heart disease of native coronary artery without angina pectoris: Secondary | ICD-10-CM

## 2021-11-08 NOTE — Patient Instructions (Signed)
Medication Instructions:  Your physician recommends that you continue on your current medications as directed. Please refer to the Current Medication list given to you today.  *If you need a refill on your cardiac medications before your next appointment, please call your pharmacy*   Lab Work: CMET, CBC, Lipid, BNP today  If you have labs (blood work) drawn today and your tests are completely normal, you will receive your results only by: MyChart Message (if you have MyChart) OR A paper copy in the mail If you have any lab test that is abnormal or we need to change your treatment, we will call you to review the results.  Follow-Up: At [facility], you and your health needs are our priority.  As part of our continuing mission to provide you with exceptional heart care, we have created designated Provider Care Teams.  These Care Teams include your primary Cardiologist (physician) and Advanced Practice Providers (APPs -  Physician Assistants and Nurse Practitioners) who all work together to provide you with the care you need, when you need it.  We recommend signing up for the patient portal called "MyChart".  Sign up information is provided on this After Visit Summary.  MyChart is used to connect with patients for Virtual Visits (Telemedicine).  Patients are able to view lab/test results, encounter notes, upcoming appointments, etc.  Non-urgent messages can be sent to your provider as well.   To learn more about what you can do with MyChart, go to [email or web address].    Your next appointment:   6 month(s)  The format for your next appointment:   In Person  Provider:   Little Ishikawa, MD      Important Information About Sugar

## 2021-11-09 LAB — COMPREHENSIVE METABOLIC PANEL
ALT: 33 IU/L (ref 0–44)
AST: 26 IU/L (ref 0–40)
Albumin/Globulin Ratio: 2.7 — ABNORMAL HIGH (ref 1.2–2.2)
Albumin: 4.6 g/dL (ref 3.8–4.8)
Alkaline Phosphatase: 92 IU/L (ref 44–121)
BUN/Creatinine Ratio: 27 — ABNORMAL HIGH (ref 10–24)
BUN: 19 mg/dL (ref 8–27)
Bilirubin Total: 0.5 mg/dL (ref 0.0–1.2)
CO2: 24 mmol/L (ref 20–29)
Calcium: 9.6 mg/dL (ref 8.6–10.2)
Chloride: 107 mmol/L — ABNORMAL HIGH (ref 96–106)
Creatinine, Ser: 0.71 mg/dL — ABNORMAL LOW (ref 0.76–1.27)
Globulin, Total: 1.7 g/dL (ref 1.5–4.5)
Glucose: 82 mg/dL (ref 70–99)
Potassium: 4.2 mmol/L (ref 3.5–5.2)
Sodium: 144 mmol/L (ref 134–144)
Total Protein: 6.3 g/dL (ref 6.0–8.5)
eGFR: 93 mL/min/{1.73_m2} (ref >59)

## 2021-11-09 LAB — CBC
Hematocrit: 41.7 % (ref 37.5–51.0)
Hemoglobin: 14.1 g/dL (ref 13.0–17.7)
MCH: 32.7 pg (ref 26.6–33.0)
MCHC: 33.8 g/dL (ref 31.5–35.7)
MCV: 97 fL (ref 79–97)
Platelets: 172 10*3/uL (ref 150–450)
RBC: 4.31 x10E6/uL (ref 4.14–5.80)
RDW: 12 % (ref 11.6–15.4)
WBC: 4.7 10*3/uL (ref 3.4–10.8)

## 2021-11-09 LAB — LIPID PANEL
Chol/HDL Ratio: 2.8 ratio (ref 0.0–5.0)
Cholesterol, Total: 116 mg/dL (ref 100–199)
HDL: 42 mg/dL (ref >39)
LDL Chol Calc (NIH): 59 mg/dL (ref 0–99)
Triglycerides: 76 mg/dL (ref 0–149)
VLDL Cholesterol Cal: 15 mg/dL (ref 5–40)

## 2021-11-09 LAB — BRAIN NATRIURETIC PEPTIDE: BNP: 64.6 pg/mL (ref 0.0–100.0)

## 2021-11-10 ENCOUNTER — Other Ambulatory Visit: Payer: Self-pay | Admitting: *Deleted

## 2021-11-10 MED ORDER — FUROSEMIDE 20 MG PO TABS: 20.0000 mg | ORAL_TABLET | ORAL | 3 refills | Status: AC | PRN

## 2021-11-13 ENCOUNTER — Telehealth: Payer: Self-pay | Admitting: Cardiology

## 2021-11-13 NOTE — Telephone Encounter (Signed)
Pt c/o medication issue:  1. Name of Medication:   furosemide (LASIX) 20 MG tablet    2. How are you currently taking this medication (dosage and times per day)? Route: Take 1 tablet (20 mg total) by mouth as needed (for weight increase of 3 pounds in 1 day or 5 pounds in 1 week). - Oral  3. Are you having a reaction (difficulty breathing--STAT)?   4. What is your medication issue? Pt would like someone to further explain how he should take this medication

## 2021-11-13 NOTE — Telephone Encounter (Signed)
Returned the call to the patient. The instructions have been gone over with the patient. He verbalized his understanding.

## 2021-12-05 DIAGNOSIS — I10 Essential (primary) hypertension: Secondary | ICD-10-CM | POA: Diagnosis not present

## 2021-12-05 DIAGNOSIS — K219 Gastro-esophageal reflux disease without esophagitis: Secondary | ICD-10-CM | POA: Diagnosis not present

## 2021-12-05 DIAGNOSIS — N401 Enlarged prostate with lower urinary tract symptoms: Secondary | ICD-10-CM | POA: Diagnosis not present

## 2021-12-05 DIAGNOSIS — E7800 Pure hypercholesterolemia, unspecified: Secondary | ICD-10-CM | POA: Diagnosis not present

## 2021-12-05 DIAGNOSIS — Z23 Encounter for immunization: Secondary | ICD-10-CM | POA: Diagnosis not present

## 2022-02-20 DIAGNOSIS — R3912 Poor urinary stream: Secondary | ICD-10-CM | POA: Diagnosis not present

## 2022-02-20 DIAGNOSIS — N35912 Unspecified bulbous urethral stricture, male: Secondary | ICD-10-CM | POA: Diagnosis not present

## 2022-02-20 DIAGNOSIS — N401 Enlarged prostate with lower urinary tract symptoms: Secondary | ICD-10-CM | POA: Diagnosis not present

## 2022-03-15 DIAGNOSIS — H353132 Nonexudative age-related macular degeneration, bilateral, intermediate dry stage: Secondary | ICD-10-CM | POA: Diagnosis not present

## 2022-03-15 DIAGNOSIS — H43813 Vitreous degeneration, bilateral: Secondary | ICD-10-CM | POA: Diagnosis not present

## 2022-03-15 DIAGNOSIS — H348312 Tributary (branch) retinal vein occlusion, right eye, stable: Secondary | ICD-10-CM | POA: Diagnosis not present

## 2022-03-15 DIAGNOSIS — H35372 Puckering of macula, left eye: Secondary | ICD-10-CM | POA: Diagnosis not present

## 2022-04-10 ENCOUNTER — Other Ambulatory Visit: Payer: Self-pay | Admitting: *Deleted

## 2022-04-10 DIAGNOSIS — I77819 Aortic ectasia, unspecified site: Secondary | ICD-10-CM

## 2022-04-10 DIAGNOSIS — Z01812 Encounter for preprocedural laboratory examination: Secondary | ICD-10-CM

## 2022-04-27 ENCOUNTER — Encounter (HOSPITAL_COMMUNITY): Payer: Self-pay

## 2022-04-27 ENCOUNTER — Ambulatory Visit (HOSPITAL_COMMUNITY)
Admission: RE | Admit: 2022-04-27 | Discharge: 2022-04-27 | Disposition: A | Discharge status: Home / Self Care | Payer: Federal, State, Local not specified - PPO | Source: Ambulatory Visit | Attending: Cardiology | Admitting: Cardiology

## 2022-04-27 DIAGNOSIS — I77819 Aortic ectasia, unspecified site: Secondary | ICD-10-CM | POA: Insufficient documentation

## 2022-04-27 DIAGNOSIS — I7121 Aneurysm of the ascending aorta, without rupture: Secondary | ICD-10-CM | POA: Diagnosis not present

## 2022-04-27 MED ORDER — IOHEXOL 350 MG/ML SOLN
75.0000 mL | Freq: Once | INTRAVENOUS | Status: AC | PRN
  Administered 2022-04-27: 75 mL via INTRAVENOUS

## 2022-05-16 NOTE — Progress Notes (Signed)
Cardiology Clinic Note   Patient Name: Joshua Estrada Date of Encounter: 05/18/2022  Primary Care Provider:  Shirline Frees, MD Primary Cardiologist:  Donato Heinz, MD  Patient Profile    Joshua Estrada 82 year old male presents to the clinic today for follow-up evaluation of his paroxysmal atrial fibrillation and hyperlipidemia.  Past Medical History    Past Medical History:  Diagnosis Date   BPH (benign prostatic hyperplasia)    Hyperlipidemia    Past Surgical History:  Procedure Laterality Date   BIOPSY  03/21/2019   Procedure: BIOPSY;  Surgeon: Ronnette Juniper, MD;  Location: [facility] ENDOSCOPY;  Service: Gastroenterology;;   Cataract surgery     COLONOSCOPY WITH PROPOFOL N/A 03/21/2019   Procedure: COLONOSCOPY WITH PROPOFOL;  Surgeon: Ronnette Juniper, MD;  Location: [place];  Service: Gastroenterology;  Laterality: N/A;   ESOPHAGOGASTRODUODENOSCOPY (EGD) WITH PROPOFOL N/A 03/21/2019   Procedure: ESOPHAGOGASTRODUODENOSCOPY (EGD) WITH PROPOFOL;  Surgeon: Ronnette Juniper, MD;  Location: [place];  Service: Gastroenterology;  Laterality: N/A;   POLYPECTOMY  03/21/2019   Procedure: POLYPECTOMY;  Surgeon: Ronnette Juniper, MD;  Location: [facility] ENDOSCOPY;  Service: Gastroenterology;;   TONSILLECTOMY     As a child    Allergies  No Known Allergies  History of Present Illness    Joshua Estrada has a PMH of paroxysmal atrial fibrillation, DVT/PE, severe anemia, HLD, BPH, GERD, aortic dilation and DOE.  He was initially referred to cardiology by his PCP for evaluation of his dyspnea on exertion.  He was initially seen 1/21.  During his workup he was noted to have severe anemia with a hemoglobin of 4.2.  He was admitted to [facility] for further evaluation.  He was noted to have iron deficiency anemia due to chronic blood loss.  He underwent colonoscopy and EGD.  It was thought his blood loss was related to large hiatal hernia.  He was prescribed supplemental iron and PPI.  During his  hospitalization he was also found to have left lower extremity DVT and CTA was positive for multiple PEs.  He was started on heparin gtt. and transition to Eliquis after his hemoglobin stabilized.  His echocardiogram showed an EF of 55-60%, mild LVH, intermediate diastolic function, normal RV function, severe left atrial dilation, moderate right atrial dilation and no significant valvular abnormalities.  His hospital course was complicated by atrial fibrillation with alternating bradycardia.  30-day cardiac monitor showed total A-fib burden of 1% with the longest episode lasting 2 hours and average heart rate of 105.  He underwent stress testing 3/21 which showed no perfusion defects.  His calcium score 06/02/2021 was 205 which placed him in the 40 th percentile.  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  His echocardiogram 06/02/2021 showed an EF of 55-60% and no significant valvular abnormalities.  He was seen in follow-up by Dr. Gardiner Rhyme on 11/08/2021.  During that time he reported that he was doing okay.  He did note swelling in his legs with the right greater than left.  He underwent lower extremity duplex which was negative.  He reported that he was briefly on furosemide which had been discontinued.  He denied chest pain, syncope, palpitations, dyspnea on exertion and bleeding issues.  He was compliant with his apixaban.  He presents to the clinic today for follow-up evaluation and states he notices occasional episodes of chest discomfort that are relieved with belching.  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  He expressed understanding.  His  blood pressure today is 124/68.  His EKG shows sinus bradycardia with sinus arrhythmia septal infarct undetermined age 19 bpm.  He is sedentary.  He does do some walking with his job.  He works for [employer] with the [place].  I encouraged him to walk daily.  We will continue his current medication  regimen.  I will plan follow-up in 1 year.  Today he denies  shortness of breath, lower extremity edema, fatigue, palpitations, melena, hematuria, hemoptysis, diaphoresis, weakness, presyncope, syncope, orthopnea, and PND.   Home Medications    Prior to Admission medications   Medication Sig Start Date End Date Taking? Authorizing Provider  atorvastatin (LIPITOR) 20 MG tablet Take 1 tablet (20 mg total) by mouth daily. 06/08/21 11/08/21  Donato Heinz, MD  ELIQUIS 5 MG TABS tablet Take 5 mg by mouth 2 (two) times daily. 05/27/19   [provider]  ferrous sulfate 325 (65 FE) MG tablet Take 1 tablet (325 mg total) by mouth 2 (two) times daily with a meal. 03/24/19   Geradine Girt, DO  furosemide (LASIX) 20 MG tablet Take 1 tablet (20 mg total) by mouth as needed (for weight increase of 3 pounds in 1 day or 5 pounds in 1 week). 11/10/21 02/08/22  Donato Heinz, MD  metoprolol succinate (TOPROL-XL) 25 MG 24 hr tablet TAKE 1 TABLET (25 MG TOTAL) BY MOUTH DAILY. 05/22/21   Donato Heinz, MD  Multiple Vitamins-Minerals (PRESERVISION AREDS 2 PO) Take 1 tablet by mouth 2 (two) times daily.     [provider]  pantoprazole (PROTONIX) 40 MG tablet Take 1 tablet (40 mg total) by mouth daily. 03/24/19   Geradine Girt, DO  tamsulosin (FLOMAX) 0.4 MG CAPS capsule Take 0.4 mg by mouth at bedtime.  02/01/19   [provider]  vitamin B-12 1000 MCG tablet Take 1 tablet (1,000 mcg total) by mouth daily. 03/24/19   Geradine Girt, DO    Family History    Family History  Problem Relation Age of Onset   Breast cancer Mother    Congestive Heart Failure Father    He indicated that his mother is deceased. He indicated that his father is deceased.  Social History    Social History   Socioeconomic History   Marital status: Single    Spouse name: Not on file   Number of children: Not on file   Years of education: Not on file   Highest education level: Not  on file  Occupational History   Not on file  Tobacco Use   Smoking status: Never   Smokeless tobacco: Never  Vaping Use   Vaping Use: Never used  Substance and Sexual Activity   Alcohol use: Never   Drug use: Never   Sexual activity: Not on file  Other Topics Concern   Not on file  Social History Narrative   Not on file   Social Determinants of Health   Financial Resource Strain: Not on file  Food Insecurity: Not on file  Transportation Needs: Not on file  Physical Activity: Not on file  Stress: Not on file  Social Connections: Not on file  Intimate Partner Violence: Not on file     Review of Systems    General:  No chills, fever, night sweats or weight changes.  Cardiovascular:  No chest pain, dyspnea on exertion, edema, orthopnea, palpitations, paroxysmal nocturnal dyspnea. Dermatological: No rash, lesions/masses Respiratory: No cough, dyspnea Urologic: No hematuria,  dysuria Abdominal:   No nausea, vomiting, diarrhea, bright red blood per rectum, melena, or hematemesis Neurologic:  No visual changes, wkns, changes in mental status. All other systems reviewed and are otherwise negative except as noted above.  Physical Exam    VS:  BP 124/68   Pulse (!) 59   Ht 6\' 1"  (1.854 m)   Wt 207 lb 6.4 oz (94.1 kg)   SpO2 98%   BMI 27.36 kg/m  , BMI Body mass index is 27.36 kg/m. GEN: Well nourished, well developed, in no acute distress. HEENT: normal. Neck: Supple, no JVD, carotid bruits, or masses. Cardiac: RRR, no murmurs, rubs, or gallops. No clubbing, cyanosis, edema.  Radials/DP/PT 2+ and equal bilaterally.  Respiratory:  Respirations regular and unlabored, clear to auscultation bilaterally. GI: Soft, nontender, nondistended, BS + x 4. MS: no deformity or atrophy. Skin: warm and dry, no rash. Neuro:  Strength and sensation are intact. Psych: Normal affect.  Accessory Clinical Findings    Recent Labs: 11/08/2021: ALT 33; BNP 64.6; BUN 19; Creatinine, Ser 0.71;  Hemoglobin 14.1; Platelets 172; Potassium 4.2; Sodium 144   Recent Lipid Panel    Component Value Date/Time   CHOL 116 11/08/2021 1658   TRIG 76 11/08/2021 1658   HDL 42 11/08/2021 1658   CHOLHDL 2.8 11/08/2021 1658   LDLCALC 59 11/08/2021 1658         ECG personally reviewed by me today-sinus bradycardia with marked sinus arrhythmia septal infarct undetermined age 19 bpm- No acute changes  Coronary calcium scoring 06/02/2021  EXAM: CT Coronary Calcium Score   TECHNIQUE: A gated, non-contrast computed tomography scan of the heart was performed using 38mm slice thickness. Axial images were analyzed on a dedicated workstation. Calcium scoring of the coronary arteries was performed using the Agatston method.   FINDINGS: Coronary Calcium Score:   Left main: 0   Left anterior descending artery: 189   Left circumflex artery: 3.5   Right coronary artery: 12.6   Total: 205   Percentile: 40   Pericardium: Normal.   Ascending Aorta: Mild dilation of ascending aorta measures approximately 70mm at the mid ascending aorta measured in an axial plane.   Non-cardiac: See separate report from [facility].   IMPRESSION: 1. Coronary calcium score of 205. This was 40th percentile for age-, race-, and sex-matched controls.   2. Mild dilation of ascending aorta measures approximately 23mm at the mid ascending aorta measured in an axial plane.   RECOMMENDATIONS: Coronary artery calcium (CAC) score is a strong predictor of incident coronary heart disease (CHD) and provides predictive information beyond traditional risk factors. CAC scoring is reasonable to use in the decision to withhold, postpone, or initiate statin therapy in intermediate-risk or selected borderline-risk asymptomatic adults (age 55-75 years and LDL-C >=70 to <190 mg/dL) who do not have diabetes or established atherosclerotic cardiovascular disease (ASCVD).* In intermediate-risk (10-year ASCVD risk  >=7.5% to <20%) adults or selected borderline-risk (10-year ASCVD risk >=5% to <7.5%) adults in whom a CAC score is measured for the purpose of making a treatment decision the following recommendations have been made:   If CAC=0, it is reasonable to withhold statin therapy and reassess in 5 to 10 years, as long as higher risk conditions are absent (diabetes mellitus, family history of premature CHD in first degree relatives (males <55 years; females <65 years), cigarette smoking, or LDL >=190 mg/dL).   If CAC is 1 to 99, it is reasonable to initiate statin therapy for patients >=55 years  of age.   If CAC is >=100 or >=75th percentile, it is reasonable to initiate statin therapy at any age.   Cardiology referral should be considered for patients with CAC scores >=400 or >=75th percentile.  Echocardiogram 06/02/2021  IMPRESSIONS     1. Left ventricular ejection fraction, by estimation, is 55 to 60%. The  left ventricle has normal function. The left ventricle has no regional  wall motion abnormalities. Left ventricular diastolic parameters were  normal.   2. Right ventricular systolic function is normal. The right ventricular  size is mildly enlarged. Tricuspid regurgitation signal is inadequate for  assessing PA pressure.   3. The mitral valve is normal in structure. No evidence of mitral valve  regurgitation. No evidence of mitral stenosis.   4. The aortic valve is normal in structure. Aortic valve regurgitation is  not visualized. No aortic stenosis is present.   5. Aortic dilatation noted. There is mild dilatation of the ascending  aorta, measuring 42 mm.   6. The inferior vena cava is dilated in size with >50% respiratory  variability, suggesting right atrial pressure of 8 mmHg.   FINDINGS   Left Ventricle: Left ventricular ejection fraction, by estimation, is 55  to 60%. The left ventricle has normal function. The left ventricle has no  regional wall motion  abnormalities. The left ventricular internal cavity  size was normal in size. There is   no left ventricular hypertrophy. Left ventricular diastolic parameters  were normal. Normal left ventricular filling pressure.   Right Ventricle: The right ventricular size is mildly enlarged. No  increase in right ventricular wall thickness. Right ventricular systolic  function is normal. Tricuspid regurgitation signal is inadequate for  assessing PA pressure.   Left Atrium: Left atrial size was normal in size.   Right Atrium: Right atrial size was normal in size.   Pericardium: There is no evidence of pericardial effusion.   Mitral Valve: The mitral valve is normal in structure. No evidence of  mitral valve regurgitation. No evidence of mitral valve stenosis.   Tricuspid Valve: The tricuspid valve is normal in structure. Tricuspid  valve regurgitation is not demonstrated. No evidence of tricuspid  stenosis.   Aortic Valve: The aortic valve is normal in structure. Aortic valve  regurgitation is not visualized. No aortic stenosis is present.   Pulmonic Valve: The pulmonic valve was normal in structure. Pulmonic valve  regurgitation is mild. No evidence of pulmonic stenosis.   Aorta: Aortic dilatation noted. There is mild dilatation of the ascending  aorta, measuring 42 mm.   Venous: The inferior vena cava is dilated in size with greater than 50%  respiratory variability, suggesting right atrial pressure of 8 mmHg.   IAS/Shunts: No atrial level shunt detected by color flow Doppler.   Nuclear stress test 05/27/2019  There was no ST segment deviation noted during stress. Defect 1: There is a small defect of moderate severity present in the apical inferior location. It was present at rest and not at stress, consistent with artifact. The study is normal. This is a low risk study. This study was not gated for systolic function due to frequent PACs and PVCs.  Assessment & Plan   1.   Paroxysmal atrial fibrillation-denies recent episodes of accelerated or irregular heartbeat.  Denies bleeding issues.  Reports compliance with his apixaban.  CHA2DS2-VASc score 3 (age x 2, DVT/PE).  Previous 30-day cardiac event monitor showed A-fib burden of 1%.  Normal echocardiogram. Continue current medical therapy Avoid triggers  caffeine, chocolate, EtOH, dehydration etc.  Coronary artery disease-no chest pain today.  Underwent nuclear stress test which showed no ischemia.  Coronary calcium score 3/23 showed a coronary calcium score of 205 which placed him in the 40th percentile for age and sex matched control. Continue statin therapy, apixaban Heart healthy low-sodium diet Increase physical activity as tolerated  Hyperlipidemia-LDL59 on 11/08/2021. Continue statin therapy Heart healthy low-sodium high-fiber diet.  History of PE/DVT-occurred in the setting of hospitalization with severe anemia. 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  Noted on coronary CTA.  Dilation of aorta measured 41 mm.  Follow-up imaging reassuring showing a 40 mm dilation. Plan for repeat chest CT angio aorta 3/26  Disposition: Follow-up with Dr. Gardiner Rhyme or me in 12 months.   Jossie Ng. Nelma Phagan NP-C     05/18/2022, 4:26 PM [facility] [address] Office [phone] Fax [phone]    I spent14 minutes examining this patient, reviewing medications, and using patient centered shared decision making involving her cardiac care.  Prior to her visit I spent greater than 20 minutes reviewing her past medical history,  medications, and prior cardiac tests.

## 2022-05-18 ENCOUNTER — Encounter: Payer: Self-pay | Admitting: General Practice

## 2022-05-18 ENCOUNTER — Ambulatory Visit: Payer: Federal, State, Local not specified - PPO | Attending: General Practice | Admitting: General Practice

## 2022-05-18 VITALS — BP 124/68 | HR 59 | Ht 73.0 in | Wt 207.4 lb

## 2022-05-18 DIAGNOSIS — I480 Paroxysmal atrial fibrillation: Secondary | ICD-10-CM

## 2022-05-18 DIAGNOSIS — I2510 Atherosclerotic heart disease of native coronary artery without angina pectoris: Secondary | ICD-10-CM | POA: Diagnosis not present

## 2022-05-18 DIAGNOSIS — I2699 Other pulmonary embolism without acute cor pulmonale: Secondary | ICD-10-CM

## 2022-05-18 DIAGNOSIS — I77819 Aortic ectasia, unspecified site: Secondary | ICD-10-CM

## 2022-05-18 DIAGNOSIS — E785 Hyperlipidemia, unspecified: Secondary | ICD-10-CM

## 2022-05-18 NOTE — Patient Instructions (Signed)
Medication Instructions:  The current medical regimen is effective;  continue present plan and medications as directed. Please refer to the Current Medication list given to you today.  *If you need a refill on your cardiac medications before your next appointment, please call your pharmacy*  Lab Work: NONE If you have labs (blood work) drawn today and your tests are completely normal, you will receive your results only by:  [place] (if you have MyChart) OR A paper copy in the mail  If you have any lab test that is abnormal or we need to change your treatment, we will call you to review the results.  Testing/Procedures: NONE  Follow-Up: At [facility], you and your health needs are our priority.  As part of our continuing mission to provide you with exceptional heart care, we have created designated Provider Care Teams.  These Care Teams include your primary Cardiologist (physician) and Advanced Practice Providers (APPs -  Physician Assistants and Nurse Practitioners) who all work together to provide you with the care you need, when you need it.  Your next appointment:   12 month(s)  Provider:   Donato Heinz, MD     Other Instructions NO STRAINING WHEN LIFTING     High-Fiber Eating Plan Fiber, also called dietary fiber, is a type of carbohydrate. It is found foods such as fruits, vegetables, whole grains, and beans. A high-fiber diet can have many health benefits. Your health care provider may recommend a high-fiber diet to help: Prevent constipation. Fiber can make your bowel movements more regular. Lower your cholesterol. Relieve the following conditions: Inflammation of veins in the anus (hemorrhoids). Inflammation of specific areas of the digestive tract (uncomplicated diverticulosis). A problem of the large intestine, also called the colon, that sometimes causes pain and diarrhea (irritable bowel syndrome, or IBS). Prevent overeating as part of a  weight-loss plan. Prevent heart disease, type 2 diabetes, and certain cancers. What are tips for following this plan? Reading food labels  Check the nutrition facts label on food products for the amount of dietary fiber. Choose foods that have 5 grams of fiber or more per serving. Choose whole fruits and vegetables instead of processed forms, such as apple juice or applesauce. Choose a wide variety of high-fiber foods such as avocados, lentils, oats, and kidney beans. Read the nutrition facts label of the foods you choose. Be aware of foods with added fiber. These foods often have high sugar and sodium amounts per serving. Cooking Use whole-grain flour for baking and cooking. Cook with brown rice instead of white rice. Meal planning Start the day with a breakfast that is high in fiber, such as a cereal that contains 5 g of fiber or more per serving. Eat breads and cereals that are made with whole-grain flour instead of refined flour or white flour. Eat brown rice, bulgur wheat, or millet instead of white rice. Use beans in place of meat in soups, salads, and pasta dishes. Be sure that half of the grains you eat each day are whole grains. General information You can get the recommended daily intake of dietary fiber by: Eating a variety of fruits, vegetables, grains, nuts, and beans. Taking a fiber supplement if you are not able to take in enough fiber in your diet. It is better to get fiber through food than from a supplement. Gradually increase how much fiber you consume. If you increase your intake of dietary fiber too quickly, you may have bloating, cramping, or gas.  Drink plenty of water to help you digest fiber. Choose high-fiber snacks, such as berries, raw vegetables, nuts, and popcorn. What foods should I eat? Fruits Berries. Pears. Apples. Oranges. Avocado. Prunes and raisins. Dried figs. Vegetables Sweet potatoes. Spinach. Kale. Artichokes. Cabbage. Broccoli. Cauliflower. Green  peas. Carrots. Squash. Grains Whole-grain breads. Multigrain cereal. Oats and oatmeal. Brown rice. Barley. Bulgur wheat. [place]. Quinoa. Bran muffins. Popcorn. Rye wafer crackers. Meats and other proteins Navy beans, kidney beans, and pinto beans. Soybeans. Split peas. Lentils. Nuts and seeds. Dairy Fiber-fortified yogurt. Beverages Fiber-fortified soy milk. Fiber-fortified orange juice. Other foods Fiber bars. The items listed above may not be a complete list of recommended foods and beverages. Contact a dietitian for more information. What foods should I avoid? Fruits Fruit juice. Cooked, strained fruit. Vegetables Fried potatoes. Canned vegetables. Well-cooked vegetables. Grains White bread. Pasta made with refined flour. White rice. Meats and other proteins Fatty cuts of meat. Fried chicken or fried fish. Dairy Milk. Yogurt. Cream cheese. Sour cream. Fats and oils Butters. Beverages Soft drinks. Other foods Cakes and pastries. The items listed above may not be a complete list of foods and beverages to avoid. Talk with your dietitian about what choices are best for you. Summary Fiber is a type of carbohydrate. It is found in foods such as fruits, vegetables, whole grains, and beans. A high-fiber diet has many benefits. It can help to prevent constipation, lower blood cholesterol, aid weight loss, and reduce your risk of heart disease, diabetes, and certain cancers. Increase your intake of fiber gradually. Increasing fiber too quickly may cause cramping, bloating, and gas. Drink plenty of water while you increase the amount of fiber you consume. The best sources of fiber include whole fruits and vegetables, whole grains, nuts, seeds, and beans.    Marland Kitchen

## 2022-05-31 ENCOUNTER — Other Ambulatory Visit: Payer: Self-pay | Admitting: Cardiology

## 2022-06-19 DIAGNOSIS — E7800 Pure hypercholesterolemia, unspecified: Secondary | ICD-10-CM | POA: Diagnosis not present

## 2022-06-19 DIAGNOSIS — I10 Essential (primary) hypertension: Secondary | ICD-10-CM | POA: Diagnosis not present

## 2022-06-19 DIAGNOSIS — K219 Gastro-esophageal reflux disease without esophagitis: Secondary | ICD-10-CM | POA: Diagnosis not present

## 2022-06-19 DIAGNOSIS — N401 Enlarged prostate with lower urinary tract symptoms: Secondary | ICD-10-CM | POA: Diagnosis not present

## 2022-09-20 DIAGNOSIS — H353122 Nonexudative age-related macular degeneration, left eye, intermediate dry stage: Secondary | ICD-10-CM | POA: Diagnosis not present

## 2022-09-20 DIAGNOSIS — H348312 Tributary (branch) retinal vein occlusion, right eye, stable: Secondary | ICD-10-CM | POA: Diagnosis not present

## 2022-09-20 DIAGNOSIS — H353114 Nonexudative age-related macular degeneration, right eye, advanced atrophic with subfoveal involvement: Secondary | ICD-10-CM | POA: Diagnosis not present

## 2022-09-20 DIAGNOSIS — H43813 Vitreous degeneration, bilateral: Secondary | ICD-10-CM | POA: Diagnosis not present

## 2022-10-04 DIAGNOSIS — H353122 Nonexudative age-related macular degeneration, left eye, intermediate dry stage: Secondary | ICD-10-CM | POA: Diagnosis not present

## 2022-11-05 DIAGNOSIS — R300 Dysuria: Secondary | ICD-10-CM | POA: Diagnosis not present

## 2022-11-05 DIAGNOSIS — J019 Acute sinusitis, unspecified: Secondary | ICD-10-CM | POA: Diagnosis not present

## 2022-11-05 DIAGNOSIS — R5383 Other fatigue: Secondary | ICD-10-CM | POA: Diagnosis not present

## 2022-11-05 DIAGNOSIS — R0981 Nasal congestion: Secondary | ICD-10-CM | POA: Diagnosis not present

## 2022-12-21 DIAGNOSIS — Z23 Encounter for immunization: Secondary | ICD-10-CM | POA: Diagnosis not present

## 2022-12-21 DIAGNOSIS — Z86711 Personal history of pulmonary embolism: Secondary | ICD-10-CM | POA: Diagnosis not present

## 2022-12-21 DIAGNOSIS — M542 Cervicalgia: Secondary | ICD-10-CM | POA: Diagnosis not present

## 2022-12-21 DIAGNOSIS — I10 Essential (primary) hypertension: Secondary | ICD-10-CM | POA: Diagnosis not present

## 2022-12-21 DIAGNOSIS — E7800 Pure hypercholesterolemia, unspecified: Secondary | ICD-10-CM | POA: Diagnosis not present

## 2023-01-03 DIAGNOSIS — R3915 Urgency of urination: Secondary | ICD-10-CM | POA: Diagnosis not present

## 2023-01-03 DIAGNOSIS — N35912 Unspecified bulbous urethral stricture, male: Secondary | ICD-10-CM | POA: Diagnosis not present

## 2023-01-03 DIAGNOSIS — N401 Enlarged prostate with lower urinary tract symptoms: Secondary | ICD-10-CM | POA: Diagnosis not present

## 2023-01-03 DIAGNOSIS — R3912 Poor urinary stream: Secondary | ICD-10-CM | POA: Diagnosis not present

## 2023-05-13 NOTE — Progress Notes (Unsigned)
 Cardiology Office Note:    Date:  05/14/2023   ID:  Joshua Estrada, DOB 1940/07/23, MRN [id number]  PCP:  Noberto Retort, MD  Cardiologist:  Little Ishikawa, MD  Electrophysiologist:  None   Referring MD: Noberto Retort, MD   Chief Complaint  Patient presents with   Chest Pain    History of Present Illness:    Joshua Estrada is a 83 y.o. male with a hx of paroxysmal atrial fibrillation, DVT/PE severe anemia, HLD, BPH, GERD who presents for follow-up.  He was referred by Dr. Tiburcio Pea for evaluation of dyspnea on exertion, initially seen on 03/17/2019 .  As part of the work-up for his dyspnea, CBC was checked and found to have severe anemia (hemoglobin 4.2).  He was admitted to [facility] for further evaluation.  Work-up of his anemia revealed iron deficiency anemia thought to be due to chronic blood loss.  Underwent colonoscopy and EGD, thought that blood loss may be related to large hiatal hernia and he will benefit from iron supplementation and PPI use.  Incidentally during hospitalization was also found to have left lower extremity DVT and CTA positive for multiple PEs.  He was started on heparin drip and transitioned to Eliquis, with stable hemoglobin.  TTE was done during hospitalization, which showed EF 55 to 60%, mild LVH, indeterminate diastolic function, normal RV systolic function, severe left atrial dilatation, moderate right atrial dilatation, no significant valvular disease.  His hospital course was also complicated by development of atrial fibrillation with alternating bradycardia.  30-day monitor was ordered as outpatient and he was not started on AV nodal blocker given bradycardia.  30-day monitor showed total AF burden 1%, with longest AF episode lasting 2 hours with average heart rate 105 bpm.  He underwent a Lexiscan Myoview on 05/27/2019 which showed no perfusion defects.  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   Echocardiogram 06/02/2021 showed EF 55 to 60%, normal RV function, no significant valvular disease.  Since last clinic visit, he reports has been having intermittent chest pain, occurs about once per week.  Reports soreness in center chest.  Goes for walks occasionally, does not note chest pain with this.  Does report he has dyspnea with exertion.  Reports chest pain can last for hours.  He denies any lightheadedness or syncope.  Denies any lower extremity edema or palpitations.  He is taking Eliquis, denies any bleeding issues.  Wt Readings from Last 3 Encounters:  05/14/23 209 lb 6.4 oz (95 kg)  05/18/22 207 lb 6.4 oz (94.1 kg)  11/08/21 207 lb 6.4 oz (94.1 kg)     Past Medical History:  Diagnosis Date   BPH (benign prostatic hyperplasia)    Hyperlipidemia     Past Surgical History:  Procedure Laterality Date   BIOPSY  03/21/2019   Procedure: BIOPSY;  Surgeon: Kerin Salen, MD;  Location: [facility] ENDOSCOPY;  Service: Gastroenterology;;   Cataract surgery     COLONOSCOPY WITH PROPOFOL N/A 03/21/2019   Procedure: COLONOSCOPY WITH PROPOFOL;  Surgeon: Kerin Salen, MD;  Location: [facility] ENDOSCOPY;  Service: Gastroenterology;  Laterality: N/A;   ESOPHAGOGASTRODUODENOSCOPY (EGD) WITH PROPOFOL N/A 03/21/2019   Procedure: ESOPHAGOGASTRODUODENOSCOPY (EGD) WITH PROPOFOL;  Surgeon: Kerin Salen, MD;  Location: [facility] ENDOSCOPY;  Service: Gastroenterology;  Laterality: N/A;   POLYPECTOMY  03/21/2019   Procedure: POLYPECTOMY;  Surgeon: Kerin Salen, MD;  Location: [facility] ENDOSCOPY;  Service: Gastroenterology;;   TONSILLECTOMY     As a  child    Current Medications: Current Meds  Medication Sig   atorvastatin (LIPITOR) 20 MG tablet TAKE 1 TABLET BY MOUTH EVERY DAY   ELIQUIS 5 MG TABS tablet Take 5 mg by mouth 2 (two) times daily.   ferrous sulfate 325 (65 FE) MG tablet Take 1 tablet (325 mg total) by mouth 2 (two) times daily with a meal.   metoprolol succinate (TOPROL-XL) 25 MG 24 hr tablet TAKE 1 TABLET (25 MG  TOTAL) BY MOUTH DAILY.   Multiple Vitamins-Minerals (PRESERVISION AREDS 2 PO) Take 1 tablet by mouth 2 (two) times daily.    pantoprazole (PROTONIX) 40 MG tablet Take 1 tablet (40 mg total) by mouth daily.   solifenacin (VESICARE) 10 MG tablet Take 10 mg by mouth daily.   tamsulosin (FLOMAX) 0.4 MG CAPS capsule Take 0.4 mg by mouth at bedtime.    vitamin B-12 1000 MCG tablet Take 1 tablet (1,000 mcg total) by mouth daily.     Allergies:   Patient has no known allergies.   Social History   Socioeconomic History   Marital status: Single    Spouse name: Not on file   Number of children: Not on file   Years of education: Not on file   Highest education level: Not on file  Occupational History   Not on file  Tobacco Use   Smoking status: Never   Smokeless tobacco: Never  Vaping Use   Vaping status: Never Used  Substance and Sexual Activity   Alcohol use: Never   Drug use: Never   Sexual activity: Not Currently    Partners: Female    Comment: widowed  Other Topics Concern   Not on file  Social History Narrative   Not on file   Social Drivers of Health   Financial Resource Strain: Not on file  Food Insecurity: Not on file  Transportation Needs: Not on file  Physical Activity: Not on file  Stress: Not on file  Social Connections: Not on file     Family History: Father had CHF  ROS:   Please see the history of present illness.    All other systems reviewed and are negative.  EKGs/Labs/Other Studies Reviewed:    The following studies were reviewed today:   EKG:   11/08/2021: Sinus rhythm, PACs, rate 62 05/14/2023: Sinus rhythm, PACs, rate 68  TTE 03/19/19:  1. Left ventricular ejection fraction, by visual estimation, is 55 to  60%. The left ventricle has normal function. Left ventricular septal wall  thickness was mildly increased. Mildly increased left ventricular  posterior wall thickness. There is mildly  increased left ventricular hypertrophy.   2. Left  ventricular diastolic parameters are indeterminate.   3. The left ventricle has no regional wall motion abnormalities.   4. Global right ventricle has normal systolic function.The right  ventricular size is normal. No increase in right ventricular wall  thickness.   5. Left atrial size was severely dilated.   6. Right atrial size was moderately dilated.   7. The mitral valve is normal in structure. Trivial mitral valve  regurgitation. No evidence of mitral stenosis.   8. The tricuspid valve is normal in structure.   9. The aortic valve is normal in structure. Aortic valve regurgitation is  not visualized. No evidence of aortic valve sclerosis or stenosis.  10. Pulmonic regurgitation is mild.  11. The pulmonic valve was normal in structure. Pulmonic valve  regurgitation is mild.  12. Normal pulmonary artery systolic pressure.  13.  The inferior vena cava is normal in size with greater than 50%  respiratory variability, suggesting right atrial pressure of 3 mmHg.   Lexiscan Myoview 05/27/2019: There was no ST segment deviation noted during stress. Defect 1: There is a small defect of moderate severity present in the apical inferior location. It was present at rest and not at stress, consistent with artifact. The study is normal. This is a low risk study. This study was not gated for systolic function due to frequent PACs and PVCs.     Cardiac monitor 05/22/19: Total AF burden 1%. Longest AF episode lasted 2 hours 9 minutes, average HR 105bpm. Fastest episode with average rate 124 bpm.   Predominant rhythm is sinus rhythm. Range is 43 to 158 bpm bpm with average of 72 bpm. No sustained ventricular tachycardia, significant pause, or high degree AV block. Ventricular ectopy 1%. 9 patient triggered events, which corresponded to sinus rhythm +/- PACs. 4 beat run of NSVT.  Total AF burden 1%.  Longest episode lasted 2 hours 9 minutes, average HR 105bpm.  Fastest episode with average rate 124 bpm.   Also with 2 episodes of atrial flutter with variable conduction, rate 70s.  Recent Labs: No results found for requested labs within last 365 days.  Recent Lipid Panel    Component Value Date/Time   CHOL 116 11/08/2021 1658   TRIG 76 11/08/2021 1658   HDL 42 11/08/2021 1658   CHOLHDL 2.8 11/08/2021 1658   LDLCALC 59 11/08/2021 1658    Physical Exam:    VS:  BP 115/70   Pulse 68   Ht 6\' 1"  (1.854 m)   Wt 209 lb 6.4 oz (95 kg)   SpO2 97%   BMI 27.63 kg/m     Wt Readings from Last 3 Encounters:  05/14/23 209 lb 6.4 oz (95 kg)  05/18/22 207 lb 6.4 oz (94.1 kg)  11/08/21 207 lb 6.4 oz (94.1 kg)     GEN:   in no acute distress HEENT: Normal NECK: No JVD CARDIAC: RRR, 2/6 systolic murmur RESPIRATORY:  Clear to auscultation without rales, wheezing or rhonchi  ABDOMEN: Soft, non-tender, non-distended MUSCULOSKELETAL: Trace BLE edema, R>L SKIN: Warm and dry NEUROLOGIC:  Alert and oriented x 3 PSYCHIATRIC:  Normal affect   ASSESSMENT:    1. Precordial pain   2. PAF (paroxysmal atrial fibrillation) (HCC)   3. Hyperlipidemia, unspecified hyperlipidemia type   4. Aortic dilatation (HCC)   5. Lower extremity edema   6. CAD in native artery      PLAN:    Paroxysmal atrial fibrillation: CHA2DS2-VASc score 3 (agex2, DVT/PE).  30-day monitor showed AF burden 1%, with longest episode lasting 2 hours with average heart rate 105 bpm.  Echo showed EF 55 to 60%, mild LVH, indeterminate diastolic function, normal RV systolic function, severe left atrial dilatation, moderate right atrial dilatation, no significant valvular disease -Continue Eliquis 5 mg twice daily.   -Continue Toprol-XL 25 mg daily.   Lower extremity edema: Has noted bilateral lower extremity edema, right greater than left.  Underwent right lower extremity duplex which showed no DVT.  Normal albumin, BNP 11/2021 -Appears improved, trace edema on exam today  PE/DVT: Diagnosed during hospitalization for anemia,  started on Eliquis 5 mg twice daily as above.  No evidence of bleeding on Eliquis.  CAD: Lexiscan Myoview shows no evidence of ischemia.  Calcium score 06/02/2021 was 205 (40th percentile) -Continue Eliquis, statin -He is reporting worsening chest pain recently.  Not a good  candidate for coronary CTA given irregular rhythm (PACs).  Recommend stress PET for further evaluation  Dyspnea: Reports dyspnea with exertion and having some lower extremity edema.  Echocardiogram 06/02/2021 showed EF 55 to 60%, normal RV function, no significant valvular disease.  Hyperlipidemia: On simvastatin 20 mg daily.  LDL 84 on 04/17/2021.  Calcium score 06/02/2021 was 205 (40th percentile), switched to atorvastatin 20 mg daily.  LDL 59 on 11/08/2021.  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  CTA chest 04/2022 showed 40 mm aortic dilatation, will monitor  RTC in 6 months  Informed Consent   Shared Decision Making/Informed Consent The risks [chest pain, shortness of breath, cardiac arrhythmias, dizziness, blood pressure fluctuations, myocardial infarction, stroke/transient ischemic attack, nausea, vomiting, allergic reaction, radiation exposure, metallic taste sensation and life-threatening complications (estimated to be 1 in 10,000)], benefits (risk stratification, diagnosing coronary artery disease, treatment guidance) and alternatives of a cardiac PET stress test were discussed in detail with Joshua Estrada and he agrees to proceed.      Medication Adjustments/Labs and Tests Ordered: Current medicines are reviewed at length with the patient today.  Concerns regarding medicines are outlined above.  Orders Placed This Encounter  Procedures   NM PET CT CARDIAC PERFUSION MULTI W/ABSOLUTE BLOODFLOW   Comprehensive Metabolic Panel (CMET)   CBC   Lipid panel   EKG 12-Lead    No orders of the defined types were placed in this encounter.    Patient Instructions  Medication  Instructions:  No Changes  Lab Work: CMET, CBC, Lipid Panel Today If you have labs (blood work) drawn today and your tests are completely normal, you will receive your results only by: MyChart Message (if you have MyChart) OR A paper copy in the mail If you have any lab test that is abnormal or we need to change your treatment, we will call you to review the results.   Testing/Procedures:    Please report to Radiology at the [facility] Main Entrance 30 minutes early for your test.  [address] [place], [place] [address]                         OR   Please report to Radiology at [facility] Main Entrance, medical mall, 30 mins prior to your test.  [address]  [place], [place]  How to Prepare for Your Cardiac PET/CT Stress Test:  Nothing to eat or drink, except water, 3 hours prior to arrival time.  NO caffeine/decaffeinated products, or chocolate 12 hours prior to arrival. (Please note decaffeinated beverages (teas/coffees) still contain caffeine).  If you have caffeine within 12 hours prior, the test will need to be rescheduled.  Medication instructions: Do not take erectile dysfunction medications for 72 hours prior to test (sildenafil, tadalafil) Do not take nitrates (isosorbide mononitrate, Ranexa) the day before or day of test Do not take tamsulosin the day before or morning of test Hold theophylline containing medications for 12 hours. Hold Dipyridamole 48 hours prior to the test.  Diabetic Preparation: If able to eat breakfast prior to 3 hour fasting, you may take all medications, including your insulin. Do not worry if you miss your breakfast dose of insulin - start at your next meal. If you do not eat prior to 3 hour fast-Hold all diabetes (oral and insulin) medications. Patients who wear a continuous glucose monitor MUST remove the device prior to  scanning.  You may take your remaining medications with water.  NO  perfume, cologne or lotion on chest or abdomen area.  Total time is 1 to 2 hours; you may want to bring reading material for the waiting time.  IF YOU THINK YOU MAY BE PREGNANT, OR ARE NURSING PLEASE INFORM THE TECHNOLOGIST.  In preparation for your appointment, medication and supplies will be purchased.  Appointment availability is limited, so if you need to cancel or reschedule, please call the Radiology Department Scheduler at [phone] 24 hours in advance to avoid a cancellation fee of $100.00  What to Expect When you Arrive:  Once you arrive and check in for your appointment, you will be taken to a preparation room within the Radiology Department.  A technologist or Nurse will obtain your medical history, verify that you are correctly prepped for the exam, and explain the procedure.  Afterwards, an IV will be started in your arm and electrodes will be placed on your skin for EKG monitoring during the stress portion of the exam. Then you will be escorted to the PET/CT scanner.  There, staff will get you positioned on the scanner and obtain a blood pressure and EKG.  During the exam, you will continue to be connected to the EKG and blood pressure machines.  A small, safe amount of a radioactive tracer will be injected in your IV to obtain a series of pictures of your heart along with an injection of a stress agent.    After your Exam:  It is recommended that you eat a meal and drink a caffeinated beverage to counter act any effects of the stress agent.  Drink plenty of fluids for the remainder of the day and urinate frequently for the first couple of hours after the exam.  Your doctor will inform you of your test results within 7-10 business days.  For more information and frequently asked questions, please visit our website: [email or web address]  For questions about your test or how to prepare for your test, please call: Cardiac Imaging Nurse Navigators Office: [phone]     Follow-Up: At [facility], you and your health needs are our priority.  As part of our continuing mission to provide you with exceptional heart care, we have created designated Provider Care Teams.  These Care Teams include your primary Cardiologist (physician) and Advanced Practice Providers (APPs -  Physician Assistants and Nurse Practitioners) who all work together to provide you with the care you need, when you need it.  We recommend signing up for the patient portal called "MyChart".  Sign up information is provided on this After Visit Summary.  MyChart is used to connect with patients for Virtual Visits (Telemedicine).  Patients are able to view lab/test results, encounter notes, upcoming appointments, etc.  Non-urgent messages can be sent to your provider as well.   To learn more about what you can do with MyChart, go to [email or web address].    Your next appointment:   6 month(s)  *Call in early May for Mid September 2025 appointment   Provider:   Little Ishikawa, MD           Signed, Little Ishikawa, MD  05/14/2023 11:27 PM    [facility]

## 2023-05-14 ENCOUNTER — Ambulatory Visit: Payer: Federal, State, Local not specified - PPO | Attending: Cardiology | Admitting: Cardiology

## 2023-05-14 ENCOUNTER — Encounter: Payer: Self-pay | Admitting: Cardiology

## 2023-05-14 VITALS — BP 115/70 | HR 68 | Ht 73.0 in | Wt 209.4 lb

## 2023-05-14 DIAGNOSIS — I2510 Atherosclerotic heart disease of native coronary artery without angina pectoris: Secondary | ICD-10-CM

## 2023-05-14 DIAGNOSIS — R600 Localized edema: Secondary | ICD-10-CM

## 2023-05-14 DIAGNOSIS — E785 Hyperlipidemia, unspecified: Secondary | ICD-10-CM | POA: Diagnosis not present

## 2023-05-14 DIAGNOSIS — R072 Precordial pain: Secondary | ICD-10-CM

## 2023-05-14 DIAGNOSIS — I77819 Aortic ectasia, unspecified site: Secondary | ICD-10-CM

## 2023-05-14 DIAGNOSIS — I480 Paroxysmal atrial fibrillation: Secondary | ICD-10-CM

## 2023-05-14 NOTE — Patient Instructions (Addendum)
 Medication Instructions:  No Changes  Lab Work: CMET, CBC, Lipid Panel Today If you have labs (blood work) drawn today and your tests are completely normal, you will receive your results only by: MyChart Message (if you have MyChart) OR A paper copy in the mail If you have any lab test that is abnormal or we need to change your treatment, we will call you to review the results.   Testing/Procedures:    Please report to Radiology at the [facility] Main Entrance 30 minutes early for your test.  [address] [place], [place] [address]                         OR   Please report to Radiology at [facility] Main Entrance, medical mall, 30 mins prior to your test.  [address]  [place], [place]  How to Prepare for Your Cardiac PET/CT Stress Test:  Nothing to eat or drink, except water, 3 hours prior to arrival time.  NO caffeine/decaffeinated products, or chocolate 12 hours prior to arrival. (Please note decaffeinated beverages (teas/coffees) still contain caffeine).  If you have caffeine within 12 hours prior, the test will need to be rescheduled.  Medication instructions: Do not take erectile dysfunction medications for 72 hours prior to test (sildenafil, tadalafil) Do not take nitrates (isosorbide mononitrate, Ranexa) the day before or day of test Do not take tamsulosin the day before or morning of test Hold theophylline containing medications for 12 hours. Hold Dipyridamole 48 hours prior to the test.  Diabetic Preparation: If able to eat breakfast prior to 3 hour fasting, you may take all medications, including your insulin. Do not worry if you miss your breakfast dose of insulin - start at your next meal. If you do not eat prior to 3 hour fast-Hold all diabetes (oral and insulin) medications. Patients who wear a continuous glucose monitor MUST remove the device prior to scanning.  You may take your remaining medications with  water.  NO perfume, cologne or lotion on chest or abdomen area.  Total time is 1 to 2 hours; you may want to bring reading material for the waiting time.  IF YOU THINK YOU MAY BE PREGNANT, OR ARE NURSING PLEASE INFORM THE TECHNOLOGIST.  In preparation for your appointment, medication and supplies will be purchased.  Appointment availability is limited, so if you need to cancel or reschedule, please call the Radiology Department Scheduler at [phone] 24 hours in advance to avoid a cancellation fee of $100.00  What to Expect When you Arrive:  Once you arrive and check in for your appointment, you will be taken to a preparation room within the Radiology Department.  A technologist or Nurse will obtain your medical history, verify that you are correctly prepped for the exam, and explain the procedure.  Afterwards, an IV will be started in your arm and electrodes will be placed on your skin for EKG monitoring during the stress portion of the exam. Then you will be escorted to the PET/CT scanner.  There, staff will get you positioned on the scanner and obtain a blood pressure and EKG.  During the exam, you will continue to be connected to the EKG and blood pressure machines.  A small, safe amount of a radioactive tracer will be injected in your IV to obtain a series of pictures of your heart along with an injection of a stress agent.    After your  Exam:  It is recommended that you eat a meal and drink a caffeinated beverage to counter act any effects of the stress agent.  Drink plenty of fluids for the remainder of the day and urinate frequently for the first couple of hours after the exam.  Your doctor will inform you of your test results within 7-10 business days.  For more information and frequently asked questions, please visit our website: [email or web address]  For questions about your test or how to prepare for your test, please call: Cardiac Imaging Nurse Navigators Office:  [phone]    Follow-Up: At [facility], you and your health needs are our priority.  As part of our continuing mission to provide you with exceptional heart care, we have created designated Provider Care Teams.  These Care Teams include your primary Cardiologist (physician) and Advanced Practice Providers (APPs -  Physician Assistants and Nurse Practitioners) who all work together to provide you with the care you need, when you need it.  We recommend signing up for the patient portal called "MyChart".  Sign up information is provided on this After Visit Summary.  MyChart is used to connect with patients for Virtual Visits (Telemedicine).  Patients are able to view lab/test results, encounter notes, upcoming appointments, etc.  Non-urgent messages can be sent to your provider as well.   To learn more about what you can do with MyChart, go to [email or web address].    Your next appointment:   6 month(s)  *Call in early May for Mid September 2025 appointment   Provider:   Little Ishikawa, MD

## 2023-05-15 LAB — CBC
Hematocrit: 42.9 % (ref 37.5–51.0)
Hemoglobin: 15 g/dL (ref 13.0–17.7)
MCH: 33.7 pg — ABNORMAL HIGH (ref 26.6–33.0)
MCHC: 35 g/dL (ref 31.5–35.7)
MCV: 96 fL (ref 79–97)
Platelets: 186 10*3/uL (ref 150–450)
RBC: 4.45 x10E6/uL (ref 4.14–5.80)
RDW: 12 % (ref 11.6–15.4)
WBC: 5.5 10*3/uL (ref 3.4–10.8)

## 2023-05-15 LAB — COMPREHENSIVE METABOLIC PANEL
ALT: 24 IU/L (ref 0–44)
AST: 23 IU/L (ref 0–40)
Albumin: 4.5 g/dL (ref 3.8–4.8)
Alkaline Phosphatase: 112 IU/L (ref 44–121)
BUN/Creatinine Ratio: 18 (ref 10–24)
BUN: 15 mg/dL (ref 8–27)
Bilirubin Total: 0.5 mg/dL (ref 0.0–1.2)
CO2: 22 mmol/L (ref 20–29)
Calcium: 9.1 mg/dL (ref 8.6–10.2)
Chloride: 106 mmol/L (ref 96–106)
Creatinine, Ser: 0.82 mg/dL (ref 0.76–1.27)
Globulin, Total: 2.1 g/dL (ref 1.5–4.5)
Glucose: 94 mg/dL (ref 70–99)
Potassium: 4.3 mmol/L (ref 3.5–5.2)
Sodium: 144 mmol/L (ref 134–144)
Total Protein: 6.6 g/dL (ref 6.0–8.5)
eGFR: 89 mL/min/{1.73_m2} (ref >59)

## 2023-05-15 LAB — LIPID PANEL
Chol/HDL Ratio: 3.4 ratio (ref 0.0–5.0)
Cholesterol, Total: 123 mg/dL (ref 100–199)
HDL: 36 mg/dL — ABNORMAL LOW (ref >39)
LDL Chol Calc (NIH): 63 mg/dL (ref 0–99)
Triglycerides: 137 mg/dL (ref 0–149)
VLDL Cholesterol Cal: 24 mg/dL (ref 5–40)

## 2023-06-03 ENCOUNTER — Other Ambulatory Visit: Payer: Self-pay | Admitting: Cardiology

## 2023-06-10 ENCOUNTER — Other Ambulatory Visit: Payer: Self-pay | Admitting: Cardiology

## 2023-08-19 ENCOUNTER — Encounter (HOSPITAL_COMMUNITY): Payer: Self-pay

## 2023-08-20 ENCOUNTER — Telehealth (HOSPITAL_COMMUNITY): Payer: Self-pay | Admitting: *Deleted

## 2023-08-20 NOTE — Telephone Encounter (Signed)
 Reaching out to patient to offer assistance regarding upcoming cardiac imaging study; pt verbalizes understanding of appt date/time, parking situation and where to check in, pre-test NPO status and medications ordered, and verified current allergies; name and call back number provided for further questions should they arise Kerri Peed RN Navigator Cardiac Imaging [facility] [phone] office [phone] cell  Aware to avoid caffeine for 12 hours prior to test, patient reports he is no longer taking flomax .

## 2023-08-21 ENCOUNTER — Ambulatory Visit (HOSPITAL_COMMUNITY)
Admission: RE | Admit: 2023-08-21 | Discharge: 2023-08-21 | Disposition: A | Discharge status: Home / Self Care | Source: Ambulatory Visit | Attending: Cardiology | Admitting: Cardiology

## 2023-08-21 ENCOUNTER — Ambulatory Visit: Payer: Self-pay | Admitting: Cardiology

## 2023-08-21 DIAGNOSIS — R072 Precordial pain: Secondary | ICD-10-CM | POA: Diagnosis present

## 2023-08-21 LAB — NM PET CT CARDIAC PERFUSION MULTI W/ABSOLUTE BLOODFLOW
MBFR: 3.32
Nuc Rest EF: 39 %
Nuc Stress EF: 52 %
Rest MBF: 0.65 ml/g/min
Rest Nuclear Isotope Dose: 24.5 mCi
ST Depression (mm): 0 mm
Stress MBF: 2.16 ml/g/min
Stress Nuclear Isotope Dose: 24.6 mCi

## 2023-08-21 MED ORDER — REGADENOSON 0.4 MG/5ML IV SOLN
0.4000 mg | Freq: Once | INTRAVENOUS | Status: AC
  Administered 2023-08-21: 0.4 mg via INTRAVENOUS

## 2023-08-21 MED ORDER — RUBIDIUM RB82 GENERATOR (RUBYFILL)
24.5000 | PACK | Freq: Once | INTRAVENOUS | Status: AC
Start: 2023-08-21 — End: 2023-08-21
  Administered 2023-08-21: 24.5 via INTRAVENOUS

## 2023-08-21 MED ORDER — RUBIDIUM RB82 GENERATOR (RUBYFILL)
24.5700 | PACK | Freq: Once | INTRAVENOUS | Status: AC
  Administered 2023-08-21: 24.57 via INTRAVENOUS

## 2023-08-21 MED ORDER — REGADENOSON 0.4 MG/5ML IV SOLN
INTRAVENOUS | Status: AC
  Filled 2023-08-21: qty 5

## 2023-08-23 NOTE — Telephone Encounter (Signed)
 Patient called and writer spoke to patient and gave results and recommendations per Dr. Alda Amas. Patient verbalized an understanding.

## 2023-11-07 ENCOUNTER — Encounter: Payer: Self-pay | Admitting: Cardiology

## 2024-01-18 ENCOUNTER — Emergency Department (HOSPITAL_COMMUNITY)
Admission: EM | Admit: 2024-01-18 | Discharge: 2024-01-18 | Disposition: A | Discharge status: Home / Self Care | Attending: Emergency Medicine | Admitting: Emergency Medicine

## 2024-01-18 ENCOUNTER — Encounter (HOSPITAL_COMMUNITY): Payer: Self-pay

## 2024-01-18 DIAGNOSIS — R040 Epistaxis: Secondary | ICD-10-CM | POA: Diagnosis present

## 2024-01-18 DIAGNOSIS — Z7901 Long term (current) use of anticoagulants: Secondary | ICD-10-CM | POA: Diagnosis not present

## 2024-01-18 LAB — CBC WITH DIFFERENTIAL/PLATELET
Abs Immature Granulocytes: 0.01 K/uL (ref 0.00–0.07)
Basophils Absolute: 0 K/uL (ref 0.0–0.1)
Basophils Relative: 0 %
Eosinophils Absolute: 0.1 K/uL (ref 0.0–0.5)
Eosinophils Relative: 2 %
HCT: 38.9 % — ABNORMAL LOW (ref 39.0–52.0)
Hemoglobin: 12.8 g/dL — ABNORMAL LOW (ref 13.0–17.0)
Immature Granulocytes: 0 %
Lymphocytes Relative: 14 %
Lymphs Abs: 0.8 K/uL (ref 0.7–4.0)
MCH: 33.1 pg (ref 26.0–34.0)
MCHC: 32.9 g/dL (ref 30.0–36.0)
MCV: 100.5 fL — ABNORMAL HIGH (ref 80.0–100.0)
Monocytes Absolute: 0.3 K/uL (ref 0.1–1.0)
Monocytes Relative: 6 %
Neutro Abs: 4.3 K/uL (ref 1.7–7.7)
Neutrophils Relative %: 78 %
Platelets: 150 K/uL (ref 150–400)
RBC: 3.87 MIL/uL — ABNORMAL LOW (ref 4.22–5.81)
RDW: 12.6 % (ref 11.5–15.5)
WBC: 5.6 K/uL (ref 4.0–10.5)
nRBC: 0 % (ref 0.0–0.2)

## 2024-01-18 MED ORDER — TRANEXAMIC ACID FOR EPISTAXIS
500.0000 mg | Freq: Once | TOPICAL | Status: AC
  Administered 2024-01-18: 500 mg via TOPICAL
  Filled 2024-01-18: qty 10

## 2024-01-18 MED ORDER — OXYMETAZOLINE HCL 0.05 % NA SOLN: 1.0000 | Freq: Once | NASAL | Status: DC

## 2024-01-18 NOTE — ED Provider Notes (Signed)
 [place] EMERGENCY DEPARTMENT AT [facility] Provider Note   CSN: [id number] Arrival date & time: 01/18/24  1220     Patient presents with: Epistaxis   Joshua Estrada is a 83 y.o. male patient with past medical history of anemia, HLD, BPH, DVT on Eliquis  sent to emergency room with nosebleed.  Patient reports that he was walking to the grocery store when his right naris spontaneously started bleeding.  He has been continuously but slowly bleeding for approximately 1 hour at this point.  He is on Eliquis  secondary to blood clot history.     Epistaxis      Prior to Admission medications   Medication Sig Start Date End Date Taking? Authorizing Provider  acetaminophen  (TYLENOL ) 650 MG CR tablet Take 650 mg by mouth every 8 (eight) hours as needed for pain.   Yes [provider]  atorvastatin  (LIPITOR) 20 MG tablet TAKE 1 TABLET BY MOUTH EVERY DAY 06/03/23  Yes Kate Lonni CROME, MD  ELIQUIS  5 MG TABS tablet Take 5 mg by mouth 2 (two) times daily. 05/27/19  Yes [provider]  ferrous sulfate  325 (65 FE) MG tablet Take 1 tablet (325 mg total) by mouth 2 (two) times daily with a meal. 03/24/19  Yes Vann, Jessica U, DO  furosemide  (LASIX ) 20 MG tablet Take 1 tablet (20 mg total) by mouth as needed (for weight increase of 3 pounds in 1 day or 5 pounds in 1 week). 11/10/21  Yes Kate Lonni CROME, MD  metoprolol  succinate (TOPROL -XL) 25 MG 24 hr tablet TAKE 1 TABLET (25 MG TOTAL) BY MOUTH DAILY. 06/11/23  Yes Kate Lonni CROME, MD  Multiple Vitamins-Minerals (PRESERVISION AREDS 2 PO) Take 1 tablet by mouth 2 (two) times daily.    Yes [provider]  pantoprazole  (PROTONIX ) 40 MG tablet Take 1 tablet (40 mg total) by mouth daily. Patient taking differently: Take 40 mg by mouth as needed. 03/24/19  Yes Vann, Jessica U, DO  solifenacin (VESICARE) 10 MG tablet Take 10 mg by mouth daily. 04/03/23  Yes [provider]  vitamin B-12 1000 MCG  tablet Take 1 tablet (1,000 mcg total) by mouth daily. 03/24/19  Yes Vann, Jessica U, DO    Allergies: Patient has no known allergies.    Review of Systems  HENT:  Positive for nosebleeds.     Updated Vital Signs BP (!) 151/72   Pulse (!) 42   Temp 98.1 F (36.7 C) (Oral)   Resp (!) 23   SpO2 99%   Physical Exam Vitals and nursing note reviewed.  Constitutional:      General: He is not in acute distress.    Appearance: He is not toxic-appearing.  HENT:     Head: Normocephalic and atraumatic.     Ears:     Comments: Bleeding from right nare. Eyes:     General: No scleral icterus.    Conjunctiva/sclera: Conjunctivae normal.  Cardiovascular:     Rate and Rhythm: Normal rate and regular rhythm.     Pulses: Normal pulses.     Heart sounds: Normal heart sounds.  Pulmonary:     Effort: Pulmonary effort is normal. No respiratory distress.     Breath sounds: Normal breath sounds.  Abdominal:     General: Abdomen is flat. Bowel sounds are normal.     Palpations: Abdomen is soft.     Tenderness: There is no abdominal tenderness.  Musculoskeletal:     Right lower leg: No edema.  Left lower leg: No edema.  Skin:    General: Skin is warm and dry.     Findings: No lesion.  Neurological:     General: No focal deficit present.     Mental Status: He is alert and oriented to person, place, and time. Mental status is at baseline.     (all labs ordered are listed, but only abnormal results are displayed) Labs Reviewed  CBC WITH DIFFERENTIAL/PLATELET - Abnormal; Notable for the following components:      Result Value   RBC 3.87 (*)    Hemoglobin 12.8 (*)    HCT 38.9 (*)    MCV 100.5 (*)    All other components within normal limits    EKG: None  Radiology: No results found.   Procedures   Medications Ordered in the ED  oxymetazoline (AFRIN) 0.05 % nasal spray 1 spray (has no administration in time range)  tranexamic acid (CYKLOKAPRON) 1000 MG/10ML topical solution  500 mg (has no administration in time range)    Clinical Course as of 01/18/24 1453  Sat Jan 18, 2024  1253 Nasal packing/afrin [JB]  1324 TXA for nasal packing [JB]  1354 Reassessed and patient is no longer having epistaxis, will observe for short time in ER. [JB]  1451 No further bleeding, vitals stable. [JB]    Clinical Course User Index [JB] Tylynn Braniff, Warren SAILOR, PA-C                                 Medical Decision Making Amount and/or Complexity of Data Reviewed Labs: ordered.  Risk OTC drugs.   This patient presents to the ED for concern of nose bleed, this involves an extensive number of treatment options, and is a complaint that carries with it a high risk of complications and morbidity.  The differential diagnosis includes anemia, trauma, nasal irritation, infection   Lab Tests:  I personally interpreted labs.  The pertinent results include:   CBC to rule out acute anemia --slightly downtrending but 12.8   Cardiac Monitoring: / EKG:  The patient was maintained on a cardiac monitor.     Problem List / ED Course / Critical interventions / Medication management  Patient presents to emergency room with right nare bleeding.  This occurred atraumatically.  On arrival patient hemodynamically stable and well-appearing.  He has continuous oozing out of right nare.  Will try direct pressure for 15 minutes with Afrin.  Will also check CBC to rule out acute anemia given he is on Eliquis  and has had blood loss with this. After Afrin and TXA.  Patients bleeding seems to have stopped.  He was observed for some time in ER and continued to have no further nosebleed.  His CBC is hemoglobin of 12.8 which is mildly downtrended from prior labs.  His vital signs are stable he is not hypotensive he is mentating well.  At this point feel he stable for discharge with close outpatient follow-up.  He was given return precautions if bleeding were to return.         Final diagnoses:   Epistaxis    ED Discharge Orders     None          Shermon Warren SAILOR, PA-C 01/18/24 1453    Tegeler, Lonni PARAS, MD 01/19/24 1536

## 2024-01-18 NOTE — ED Triage Notes (Signed)
 Pt BIB [facility] from [employer] where the pt was shopping at. Pt reports he felt fine before the incident. Had nose bleed before, but not as bad like this. Unable to stop the bleed. Pt is on eliquis  for afib. Denies HA, n/v, CP. A&O X4.

## 2024-01-18 NOTE — Discharge Instructions (Signed)
 Please follow-up with your primary care in approximately 5 to 7 days for repeat hemoglobin to make sure this is stable.  Make sure you apply direct pressure to your nose for approximately 15 to 30 minutes.  You can also try Afrin into your nose.  If bleeding reoccurs you can drink cold water which sometimes helps with bleeding.  Please return to ER with new or worsening symptoms.
